# Patient Record
Sex: Female | Born: 1975 | Race: Black or African American | Hispanic: No | Marital: Single | Smoking: Current some day smoker
Health system: Southern US, Community
[De-identification: ages and names within clinical notes are randomized; demographics above are authoritative.]

---

## 2017-09-12 ENCOUNTER — Emergency Department (HOSPITAL_COMMUNITY)
Admission: EM | Admit: 2017-09-12 | Discharge: 2017-09-12 | Discharge status: Against Medical Advice | Payer: Medicaid Other | Attending: Emergency Medicine | Admitting: Emergency Medicine

## 2017-09-12 ENCOUNTER — Encounter (HOSPITAL_COMMUNITY): Payer: Self-pay | Admitting: *Deleted

## 2017-09-12 DIAGNOSIS — R51 Headache: Secondary | ICD-10-CM | POA: Insufficient documentation

## 2017-09-12 NOTE — ED Triage Notes (Signed)
Pt says she has headache some dizziness and is worried about hypertension.  Pt denies having hx of HTN.  Pt is c/o left eye swelling.  She thinks she is having allergies after moving from [place].  No meds taken at home.

## 2017-09-12 NOTE — ED Notes (Signed)
Called for room, no answer

## 2017-09-12 NOTE — ED Notes (Signed)
Pt left said she was going somewhere else

## 2017-09-13 ENCOUNTER — Encounter (HOSPITAL_COMMUNITY): Payer: Self-pay | Admitting: Emergency Medicine

## 2017-09-13 ENCOUNTER — Ambulatory Visit (HOSPITAL_COMMUNITY)
Admission: EM | Admit: 2017-09-13 | Discharge: 2017-09-13 | Disposition: A | Discharge status: Home / Self Care | Payer: Self-pay | Attending: Family Medicine | Admitting: Family Medicine

## 2017-09-13 DIAGNOSIS — H00014 Hordeolum externum left upper eyelid: Secondary | ICD-10-CM

## 2017-09-13 DIAGNOSIS — S39012A Strain of muscle, fascia and tendon of lower back, initial encounter: Secondary | ICD-10-CM

## 2017-09-13 MED ORDER — PREDNISONE 20 MG PO TABS: ORAL_TABLET | ORAL | 0 refills | Status: DC

## 2017-09-13 MED ORDER — TOBRAMYCIN 0.3 % OP SOLN: 1.0000 [drp] | Freq: Four times a day (QID) | OPHTHALMIC | 0 refills | Status: DC

## 2017-09-13 NOTE — Discharge Instructions (Signed)
Warm compresses to the left upper eyelid

## 2017-09-13 NOTE — ED Triage Notes (Signed)
PT reports lower back pain that is worse with movement for 5 days. No dysuria.  PT has a "bump" on left eye for 2 weeks.

## 2017-09-13 NOTE — ED Provider Notes (Signed)
  [facility]   [id number] 09/13/17 Arrival Time: 1229   SUBJECTIVE:  Vicki Vincent is a 41 y.o. female who presents to the urgent care with complaint of PT reports lower back pain that is worse with movement for 5 days. No dysuria.  No recalled injury.  PT has a "bump" on left eye for 2 weeks.   Patient recently moved from [facility] which involves moving quite a bit of personal belongings. Her mother is sick and lives in medicine. Patient has a new job working in the lab.  Also notices her face has broken out.     History reviewed. No pertinent past medical history. No family history on file. Social History   Social History  . Marital status: Single    Spouse name: N/A  . Number of children: N/A  . Years of education: N/A   Occupational History  . Not on file.   Social History Main Topics  . Smoking status: Current Every Day Smoker    Packs/day: 0.20    Types: Cigarettes  . Smokeless tobacco: Never Used  . Alcohol use Yes  . Drug use: No  . Sexual activity: Not on file   Other Topics Concern  . Not on file   Social History Narrative  . No narrative on file   No outpatient prescriptions have been marked as taking for the 09/13/17 encounter [facility] Encounter).   No Known Allergies    ROS: As per HPI, remainder of ROS negative.   OBJECTIVE:   Vitals:   09/13/17 1329 09/13/17 1331  BP:  113/84  Pulse:  84  Resp:  16  Temp:  98.6 F (37 C)  TempSrc:  Oral  SpO2:  100%  Weight: 220 lb (99.8 kg)   Height:  (1.727 m)      General appearance: alert; no distress Eyes: PERRL; EOMI; conjunctiva normal HENT: normocephalic; atraumatic; TMs normal, canal normal, external ears normal without trauma; nasal mucosa normal; oral mucosa normal Neck: supple Lungs: clear to auscultation bilaterally Heart: regular rate and rhythm Abdomen: soft, non-tender; bowel sounds normal; no masses or organomegaly; no guarding or rebound tenderness Back:  no CVA tenderness Extremities: no cyanosis or edema; symmetrical with no gross deformities Skin: warm and dry Neurologic: normal gait; grossly normal Psychological: alert and cooperative; normal mood and affect      Labs:  No results found for this or any previous visit.  Labs Reviewed - No data to display  No results found.     ASSESSMENT & PLAN:  1. Strain of lumbar region, initial encounter   2. Hordeolum externum of left upper eyelid     Meds ordered this encounter  Medications  . tobramycin (TOBREX) 0.3 % ophthalmic solution    Sig: Place 1 drop into the left eye every 6 (six) hours.    Dispense:  5 mL    Refill:  0  . predniSONE (DELTASONE) 20 MG tablet    Sig: Two daily with food    Dispense:  10 tablet    Refill:  0    Reviewed expectations re: course of current medical issues. Questions answered. Outlined signs and symptoms indicating need for more acute intervention. Patient verbalized understanding. After Visit Summary given.    Procedures:      Elvina Sidle, MD 09/13/17 1339

## 2017-11-13 ENCOUNTER — Ambulatory Visit (HOSPITAL_COMMUNITY)
Admission: EM | Admit: 2017-11-13 | Discharge: 2017-11-13 | Disposition: A | Discharge status: Home / Self Care | Payer: Medicaid Other | Attending: Family Medicine | Admitting: Family Medicine

## 2017-11-13 ENCOUNTER — Encounter (HOSPITAL_COMMUNITY): Payer: Self-pay | Admitting: Emergency Medicine

## 2017-11-13 DIAGNOSIS — H5789 Other specified disorders of eye and adnexa: Secondary | ICD-10-CM

## 2017-11-13 MED ORDER — POLYETHYL GLYCOL-PROPYL GLYCOL 0.4-0.3 % OP GEL: 1.0000 "application " | Freq: Every evening | OPHTHALMIC | 0 refills | Status: DC | PRN

## 2017-11-13 MED ORDER — SULFACETAMIDE SODIUM 10 % OP SOLN: 1.0000 [drp] | Freq: Four times a day (QID) | OPHTHALMIC | 0 refills | Status: AC

## 2017-11-13 NOTE — ED Provider Notes (Signed)
[facility]    CSN: [id number] Arrival date & time: 11/13/17  1417     History   Chief Complaint Chief Complaint  Patient presents with  . Eye Problem    HPI Vicki Vincent is a 41 y.o. female.   41 year old female comes in for eye irritation after possible paint getting into bilateral eyes. Patient states she was at a store buying paint, when the sales person closed the cap of the paint can, paint splashed all over her. She used her hands to remove paint on her face at the time without irritation to the eyes. States later on in the day at home, noticed some stinging sensation to the eyes with watering and blurred vision. She went to bed and woke up with crusting on bilateral eyes and came in for evaluation. She denies sensitivity to light, contact lens use. She does have prescription glasses, but does not have it on her today. No eye redness.       History reviewed. No pertinent past medical history.  There are no active problems to display for this patient.   History reviewed. No pertinent surgical history.  OB History    No data available       Home Medications    Prior to Admission medications   Medication Sig Start Date End Date Taking? Authorizing Provider  Polyethyl Glycol-Propyl Glycol (SYSTANE) 0.4-0.3 % GEL ophthalmic gel Place 1 application into both eyes at bedtime as needed. 11/13/17   Belinda FisherYu, Amy V, PA-C  predniSONE (DELTASONE) 20 MG tablet Two daily with food 09/13/17   Elvina SidleLauenstein, Kurt, MD  sulfacetamide (BLEPH-10) 10 % ophthalmic solution Place 1-2 drops into both eyes 4 (four) times daily for 5 days. 11/13/17 11/18/17  Cathie HoopsYu, Amy V, PA-C  tobramycin (TOBREX) 0.3 % ophthalmic solution Place 1 drop into the left eye every 6 (six) hours. 09/13/17   Elvina SidleLauenstein, Kurt, MD    Family History History reviewed. No pertinent family history.  Social History Social History   Tobacco Use  . Smoking status: Current Every Day Smoker    Packs/day: 0.20   Types: Cigarettes  . Smokeless tobacco: Never Used  Substance Use Topics  . Alcohol use: Yes  . Drug use: No     Allergies   Patient has no known allergies.   Review of Systems Review of Systems  Reason unable to perform ROS: See HPI as above.     Physical Exam Triage Vital Signs ED Triage Vitals  Enc Vitals Group     BP 11/13/17 1444 108/74     Pulse Rate 11/13/17 1444 84     Resp 11/13/17 1444 18     Temp 11/13/17 1444 98 F (36.7 C)     Temp Source 11/13/17 1444 Oral     SpO2 11/13/17 1444 99 %     Weight --      Height --      Head Circumference --      Peak Flow --      Pain Score 11/13/17 1446 8     Pain Loc --      Pain Edu? --      Excl. in GC? --    No data found.  Updated Vital Signs BP 108/74 (BP Location: Left Arm)   Pulse 84   Temp 98 F (36.7 C) (Oral)   Resp 18   LMP 11/02/2017   SpO2 99%   Visual Acuity Right Eye Distance: (S) 20/100 did not have  her glasses on  Left Eye Distance: (S) 20/70 did not have her glassse on Bilateral Distance: (S) 20/50 did not have her glasses on   Right Eye Near:   Left Eye Near:    Bilateral Near:     Physical Exam  Constitutional: She is oriented to person, place, and time. She appears well-developed and well-nourished. No distress.  HENT:  Head: Normocephalic and atraumatic.  Eyes: Conjunctivae, EOM and lids are normal. Pupils are equal, round, and reactive to light. Lids are everted and swept, no foreign bodies found. Right conjunctiva is not injected. Left conjunctiva is not injected.  Fluorescein stain without uptake.  Neurological: She is alert and oriented to person, place, and time.     UC Treatments / Results  Labs (all labs ordered are listed, but only abnormal results are displayed) Labs Reviewed - No data to display  EKG  EKG Interpretation None       Radiology No results found.  Procedures Procedures (including critical care time)  Medications Ordered in UC Medications -  No data to display   Initial Impression / Assessment and Plan / UC Course  I have reviewed the triage vital signs and the nursing notes.  Pertinent labs & imaging results that were available during my care of the patient were reviewed by me and considered in my medical decision making (see chart for details).    Normal eye exam. Discussed case with Dr Milus GlazierLauenstein, given one day history of event, pH was not obtained. Given crusting of the eye this morning, will cover with sulfacetamide. Artificial tear gel as directed. Lid scrubs and warm compresses. Patient to follow up with ophthalmology if symptoms worsens, or does not improve.   Final Clinical Impressions(s) / UC Diagnoses   Final diagnoses:  Eye irritation    ED Discharge Orders        Ordered    sulfacetamide (BLEPH-10) 10 % ophthalmic solution  4 times daily     11/13/17 1507    Polyethyl Glycol-Propyl Glycol (SYSTANE) 0.4-0.3 % GEL ophthalmic gel  At bedtime PRN     11/13/17 1507       Belinda FisherYu, Amy V, PA-C 11/13/17 1518

## 2017-11-13 NOTE — Discharge Instructions (Signed)
Use sulfacetamide eyedrops as directed on both eyes. Artificial tear gel at night. Wait 10-15 minutes between drops, always use artificial tear gel last, as it prevents drops from penetrating through. Lid scrubs and warm compresses as directed. Monitor for any worsening of symptoms, changes in vision, sensitivity to light, eye swelling, follow up with ophthalmology for further evaluation.

## 2017-11-13 NOTE — ED Triage Notes (Signed)
PT C/O: house pain being splashed on bilateral eyes while at work   ONSET: yest  SX INCLUDE: itching and burning, and watery, blurred vision  DENIES:  TAKING MEDS: none  A&O x4... NAD... Ambulatory

## 2018-03-16 ENCOUNTER — Encounter (HOSPITAL_COMMUNITY): Payer: Self-pay | Admitting: Family Medicine

## 2018-03-16 ENCOUNTER — Ambulatory Visit (HOSPITAL_COMMUNITY)
Admission: EM | Admit: 2018-03-16 | Discharge: 2018-03-16 | Disposition: A | Discharge status: Home / Self Care | Payer: Medicaid Other | Attending: Family Medicine | Admitting: Family Medicine

## 2018-03-16 DIAGNOSIS — S39012A Strain of muscle, fascia and tendon of lower back, initial encounter: Secondary | ICD-10-CM | POA: Diagnosis not present

## 2018-03-16 DIAGNOSIS — M545 Low back pain: Secondary | ICD-10-CM

## 2018-03-16 LAB — POCT URINALYSIS DIP (DEVICE)
Bilirubin Urine: NEGATIVE
Glucose, UA: NEGATIVE mg/dL
Ketones, ur: NEGATIVE mg/dL
Leukocytes, UA: NEGATIVE
NITRITE: NEGATIVE
PH: 7 (ref 5.0–8.0)
Protein, ur: NEGATIVE mg/dL
SPECIFIC GRAVITY, URINE: 1.02 (ref 1.005–1.030)
UROBILINOGEN UA: 2 mg/dL — AB (ref 0.0–1.0)

## 2018-03-16 MED ORDER — METHYLPREDNISOLONE 4 MG PO TABS: 4.0000 mg | ORAL_TABLET | Freq: Three times a day (TID) | ORAL | 0 refills | Status: DC

## 2018-03-16 MED ORDER — TRAMADOL HCL 50 MG PO TABS: 50.0000 mg | ORAL_TABLET | Freq: Four times a day (QID) | ORAL | 0 refills | Status: DC | PRN

## 2018-03-16 NOTE — ED Provider Notes (Signed)
[facility]   [id number] 03/16/18 Arrival Time: 1400   SUBJECTIVE:  Vicki Vincent is a 42 y.o. 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  Some pain radiating into buttocks.    Patient is on her menstrual cycle.     History reviewed. No pertinent past medical history. No family history on file. Social History   Socioeconomic History  . Marital status: Single    Spouse name: Not on file  . Number of children: Not on file  . Years of education: Not on file  . Highest education level: Not on file  Occupational History  . Not on file  Social Needs  . Financial resource strain: Not on file  . Food insecurity:    Worry: Not on file    Inability: Not on file  . Transportation needs:    Medical: Not on file    Non-medical: Not on file  Tobacco Use  . Smoking status: Current Every Day Smoker    Packs/day: 0.20    Types: Cigarettes  . Smokeless tobacco: Never Used  Substance and Sexual Activity  . Alcohol use: Yes  . Drug use: No  . Sexual activity: Not on file  Lifestyle  . Physical activity:    Days per week: Not on file    Minutes per session: Not on file  . Stress: Not on file  Relationships  . Social connections:    Talks on phone: Not on file    Gets together: Not on file    Attends religious service: Not on file    Active member of club or organization: Not on file    Attends meetings of clubs or organizations: Not on file    Relationship status: Not on file  . Intimate partner violence:    Fear of current or ex partner: Not on file    Emotionally abused: Not on file    Physically abused: Not on file    Forced sexual activity: Not on file  Other Topics Concern  . Not on file  Social History Narrative  . Not on file   No outpatient medications have been marked as taking for the 03/16/18 encounter  [facility](Hospital Encounter).   No Known Allergies    ROS: As per HPI, remainder of ROS negative.   OBJECTIVE:   Vitals:   03/16/18 1450  BP: 107/71  Pulse: 90  Resp: 16  Temp: 98.2 F (36.8 C)  TempSrc: Oral  SpO2: 98%     General appearance: alert; no distress Eyes: PERRL; EOMI; conjunctiva normal HENT: normocephalic; atraumatic; TMs normal, canal normal, external ears normal without trauma; nasal mucosa normal; oral mucosa normal Neck: supple Abdomen: soft, non-tender; bowel sounds normal; no masses or organomegaly; no guarding or rebound tenderness Back: no CVA tenderness; tender paraspinal lumbar muscles.  Extremities: no cyanosis or edema; symmetrical with no gross deformities Skin: warm and dry Neurologic: slow waddling gait; grossly normal Psychological: alert and cooperative; normal mood and affect      Labs:  Results for orders placed or performed during the hospital encounter of 03/16/18  POCT urinalysis dip (device)  Result Value Ref Range   Glucose, UA NEGATIVE NEGATIVE mg/dL   Bilirubin Urine NEGATIVE NEGATIVE   Ketones, ur NEGATIVE NEGATIVE mg/dL   Specific Gravity, Urine 1.020 1.005 - 1.030   Hgb urine dipstick LARGE (A) NEGATIVE  pH 7.0 5.0 - 8.0   Protein, ur NEGATIVE NEGATIVE mg/dL   Urobilinogen, UA 2.0 (H) 0.0 - 1.0 mg/dL   Nitrite NEGATIVE NEGATIVE   Leukocytes, UA NEGATIVE NEGATIVE    Labs Reviewed  POCT URINALYSIS DIP (DEVICE) - Abnormal; Notable for the following components:      Result Value   Hgb urine dipstick LARGE (*)    Urobilinogen, UA 2.0 (*)    All other components within normal limits    No results found.     ASSESSMENT & PLAN:  1. Strain of lumbar region, initial encounter     Meds ordered this encounter  Medications  . methylPREDNISolone (MEDROL) 4 MG tablet    Sig: Take 1 tablet (4 mg total) by mouth 3 (three) times daily before meals.    Dispense:  15 tablet    Refill:  0  . traMADol (ULTRAM) 50 MG tablet      Sig: Take 1 tablet (50 mg total) by mouth every 6 (six) hours as needed.    Dispense:  15 tablet    Refill:  0    Reviewed expectations re: course of current medical issues. Questions answered. Outlined signs and symptoms indicating need for more acute intervention. Patient verbalized understanding. After Visit Summary given.     Elvina Sidle, MD 03/16/18 [phone]

## 2018-03-16 NOTE — ED Triage Notes (Signed)
Pt here for bilateral lower back pain with radiation into her legs. Denies injury,

## 2019-06-04 ENCOUNTER — Encounter (HOSPITAL_COMMUNITY): Payer: Self-pay | Admitting: Emergency Medicine

## 2019-06-04 ENCOUNTER — Other Ambulatory Visit: Payer: Self-pay

## 2019-06-04 ENCOUNTER — Ambulatory Visit (HOSPITAL_COMMUNITY)
Admission: EM | Admit: 2019-06-04 | Discharge: 2019-06-04 | Disposition: A | Discharge status: Home / Self Care | Payer: Medicaid Other | Attending: Family Medicine | Admitting: Family Medicine

## 2019-06-04 DIAGNOSIS — M62830 Muscle spasm of back: Secondary | ICD-10-CM | POA: Diagnosis not present

## 2019-06-04 DIAGNOSIS — N760 Acute vaginitis: Secondary | ICD-10-CM

## 2019-06-04 DIAGNOSIS — B9689 Other specified bacterial agents as the cause of diseases classified elsewhere: Secondary | ICD-10-CM | POA: Diagnosis not present

## 2019-06-04 DIAGNOSIS — Z3202 Encounter for pregnancy test, result negative: Secondary | ICD-10-CM | POA: Diagnosis not present

## 2019-06-04 LAB — POCT URINALYSIS DIP (DEVICE)
Bilirubin Urine: NEGATIVE
Glucose, UA: NEGATIVE mg/dL
Ketones, ur: NEGATIVE mg/dL
Leukocytes,Ua: NEGATIVE
Nitrite: NEGATIVE
Protein, ur: NEGATIVE mg/dL
Specific Gravity, Urine: 1.02 (ref 1.005–1.030)
Urobilinogen, UA: 1 mg/dL (ref 0.0–1.0)
pH: 7.5 (ref 5.0–8.0)

## 2019-06-04 LAB — POCT PREGNANCY, URINE: Preg Test, Ur: NEGATIVE

## 2019-06-04 MED ORDER — CYCLOBENZAPRINE HCL 5 MG PO TABS: 5.0000 mg | ORAL_TABLET | Freq: Two times a day (BID) | ORAL | 0 refills | Status: AC | PRN

## 2019-06-04 MED ORDER — FLUCONAZOLE 200 MG PO TABS: 200.0000 mg | ORAL_TABLET | Freq: Once | ORAL | 0 refills | Status: AC

## 2019-06-04 MED ORDER — METRONIDAZOLE 500 MG PO TABS: 500.0000 mg | ORAL_TABLET | Freq: Two times a day (BID) | ORAL | 0 refills | Status: DC

## 2019-06-04 NOTE — ED Triage Notes (Addendum)
2 months ago had an abortion, but did not go to follow up visit.  Patient has noticed vaginal discharge 2 days ago and now has back pain, left, mid back pain

## 2019-06-04 NOTE — Discharge Instructions (Signed)
We will call you if your tests are positive, and send medicine if needed.

## 2019-06-04 NOTE — ED Provider Notes (Signed)
[place]    CSN: [id number] Arrival date & time: 06/04/19  1221     History   Chief Complaint Chief Complaint  Patient presents with  . Back Pain    HPI Vicki Vincent is a 43 y.o. female presenting for multiple concerns.  Patient reporting left upper to mid back pain and tension for the last 2 days.  Patient states that she has a history of spasms, had several yesterday.  Patient denies injury to the area, radiation of pain, upper extremity paresthesias, neck pain.  She has tried hot compresses, stretching, and Aleve with minimal relief of symptoms.  States she has taken Flexeril in the past for this with adequate relief.  Patient also notes vaginal discharge for the last few weeks.  Patient has history of BV, states that this feels the same.  Patient is endorsing thin, malodorous discharge.  Patient denies abdominal pain, pelvic pain, vaginal pain/pruritus, anogenital lesions, vaginal bleeding.  Patient denies douching, change in detergent, or using "smelly things because my doctor told me not to ".  Patient states that she was treated with metronidazole for this years ago with successful resolution of symptoms.  Patient states that she does tend to get yeast infections after antibiotics.  Of note, patient underwent procedural abortion 2 months ago, and was told after the procedure to do urine pregnancy test, but never did this.  Patient currently sexually active with one female partner, not using condoms.  Patient requesting STD screening "to make sure I'm good ".     History reviewed. No pertinent past medical history.  There are no active problems to display for this patient.   History reviewed. No pertinent surgical history.  OB History   No obstetric history on file.      Home Medications    Prior to Admission medications   Medication Sig Start Date End Date Taking? Authorizing Provider  norgestimate-ethinyl estradiol (ORTHO-CYCLEN) 0.25-35 MG-MCG tablet  Take 1 tablet by mouth daily.   Yes [provider]  cyclobenzaprine (FLEXERIL) 5 MG tablet Take 1 tablet (5 mg total) by mouth 2 (two) times daily as needed for up to 5 days for muscle spasms. 06/04/19 06/09/19  Hall-Potvin, [place], PA-C  fluconazole (DIFLUCAN) 200 MG tablet Take 1 tablet (200 mg total) by mouth once for 1 dose. May repeat in 72 hours if needed 06/04/19 06/04/19  Hall-Potvin, [place], PA-C  metroNIDAZOLE (FLAGYL) 500 MG tablet Take 1 tablet (500 mg total) by mouth 2 (two) times daily. 06/04/19   Hall-Potvin, [place], PA-C    Family History History reviewed. No pertinent family history.  Social History Social History   Tobacco Use  . Smoking status: Current Every Day Smoker    Packs/day: 0.20    Types: Cigarettes  . Smokeless tobacco: Never Used  Substance Use Topics  . Alcohol use: Yes  . Drug use: No     Allergies   Patient has no known allergies.   Review of Systems As per HPI   Physical Exam Triage Vital Signs ED Triage Vitals  Enc Vitals Group     BP 06/04/19 1242 114/78     Pulse Rate 06/04/19 1242 97     Resp 06/04/19 1242 18     Temp 06/04/19 1242 98.3 F (36.8 C)     Temp Source 06/04/19 1242 Oral     SpO2 06/04/19 1242 97 %     Weight --      Height --  Head Circumference --      Peak Flow --      Pain Score 06/04/19 1237 9     Pain Loc --      Pain Edu? --      Excl. in GC? --    No data found.  Updated Vital Signs BP 114/78 (BP Location: Right Arm) Comment (BP Location): large cuff  Pulse 97   Temp 98.3 F (36.8 C) (Oral)   Resp 18   LMP 05/28/2019   SpO2 97%   Visual Acuity Right Eye Distance:   Left Eye Distance:   Bilateral Distance:    Right Eye Near:   Left Eye Near:    Bilateral Near:     Physical Exam Constitutional:      General: She is not in acute distress. HENT:     Head: Normocephalic and atraumatic.  Eyes:     General: No scleral icterus.    Pupils: Pupils are equal, round, and reactive  to light.  Cardiovascular:     Rate and Rhythm: Normal rate.  Pulmonary:     Effort: Pulmonary effort is normal.  Skin:    Coloration: Skin is not jaundiced or pale.  Neurological:     Mental Status: She is alert and oriented to person, place, and time.      UC Treatments / Results  Labs (all labs ordered are listed, but only abnormal results are displayed) Labs Reviewed  POCT URINALYSIS DIP (DEVICE) - Abnormal; Notable for the following components:      Result Value   Hgb urine dipstick TRACE (*)    All other components within normal limits  POC URINE PREG, ED  POCT PREGNANCY, URINE  CERVICOVAGINAL ANCILLARY ONLY    EKG None  Radiology No results found.  Procedures Procedures (including critical care time)  Medications Ordered in UC Medications - No data to display  Initial Impression / Assessment and Plan / UC Course  I have reviewed the triage vital signs and the nursing notes.  Pertinent labs & imaging results that were available during my care of the patient were reviewed by me and considered in my medical decision making (see chart for details).  Clinical Course as of Jun 03 1345  Wed Jun 04, 2019  1243 Cervicovaginal ancillary only [BH]    Clinical Course User Index [BH] Hall-Potvin, [place], [place]-C    43 year old female with a remote history of BV and muscle spasms presenting for abnormal discharge and left upper back pain/spasm.  Physical exam reassuring.  Will treat for spasm, BV and provide Diflucan as patient tends to get yeast infections status post antibiotic use. Final Clinical Impressions(s) / UC Diagnoses   Final diagnoses:  BV (bacterial vaginosis)  Muscle spasm of back     Discharge Instructions     We will call you if your tests are positive, and send medicine if needed.    ED Prescriptions    Medication Sig Dispense Auth. Provider   metroNIDAZOLE (FLAGYL) 500 MG tablet Take 1 tablet (500 mg total) by mouth 2 (two) times daily. 14  tablet Hall-Potvin, [place], PA-C   fluconazole (DIFLUCAN) 200 MG tablet Take 1 tablet (200 mg total) by mouth once for 1 dose. May repeat in 72 hours if needed 2 tablet Hall-Potvin, [place], PA-C   cyclobenzaprine (FLEXERIL) 5 MG tablet Take 1 tablet (5 mg total) by mouth 2 (two) times daily as needed for up to 5 days for muscle spasms. 10 tablet Hall-Potvin, [place], PA-C  Controlled Substance Prescriptions [place] Controlled Substance Registry consulted? Not Applicable   Shea EvansHall-Potvin, , [place] JerseyPA-C 06/04/19 1347

## 2019-06-05 LAB — CERVICOVAGINAL ANCILLARY ONLY
Bacterial vaginitis: POSITIVE — AB
Candida vaginitis: NEGATIVE
Chlamydia: NEGATIVE
Neisseria Gonorrhea: NEGATIVE
Trichomonas: NEGATIVE

## 2019-07-08 ENCOUNTER — Ambulatory Visit (HOSPITAL_COMMUNITY)
Admission: EM | Admit: 2019-07-08 | Discharge: 2019-07-08 | Disposition: A | Discharge status: Home / Self Care | Payer: Medicaid Other | Attending: Family Medicine | Admitting: Family Medicine

## 2019-07-08 ENCOUNTER — Encounter (HOSPITAL_COMMUNITY): Payer: Self-pay | Admitting: Family Medicine

## 2019-07-08 ENCOUNTER — Other Ambulatory Visit: Payer: Self-pay

## 2019-07-08 DIAGNOSIS — M542 Cervicalgia: Secondary | ICD-10-CM

## 2019-07-08 DIAGNOSIS — M545 Low back pain, unspecified: Secondary | ICD-10-CM

## 2019-07-08 MED ORDER — MELOXICAM 7.5 MG PO TABS: 7.5000 mg | ORAL_TABLET | Freq: Every day | ORAL | 0 refills | Status: DC

## 2019-07-08 MED ORDER — CYCLOBENZAPRINE HCL 10 MG PO TABS: 10.0000 mg | ORAL_TABLET | Freq: Two times a day (BID) | ORAL | 0 refills | Status: DC | PRN

## 2019-07-08 MED ORDER — KETOROLAC TROMETHAMINE 30 MG/ML IJ SOLN
30.0000 mg | Freq: Once | INTRAMUSCULAR | Status: AC
  Administered 2019-07-08: 30 mg via INTRAMUSCULAR

## 2019-07-08 MED ORDER — KETOROLAC TROMETHAMINE 30 MG/ML IJ SOLN
INTRAMUSCULAR | Status: AC
  Filled 2019-07-08: qty 1
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
## 2019-07-23 ENCOUNTER — Emergency Department (HOSPITAL_COMMUNITY): Payer: Medicaid Other

## 2019-07-23 ENCOUNTER — Emergency Department (HOSPITAL_COMMUNITY)
Admission: EM | Admit: 2019-07-23 | Discharge: 2019-07-24 | Discharge status: Against Medical Advice | Payer: Medicaid Other | Attending: Emergency Medicine | Admitting: Emergency Medicine

## 2019-07-23 ENCOUNTER — Other Ambulatory Visit: Payer: Self-pay

## 2019-07-23 ENCOUNTER — Encounter (HOSPITAL_COMMUNITY): Payer: Self-pay

## 2019-07-23 DIAGNOSIS — F1310 Sedative, hypnotic or anxiolytic abuse, uncomplicated: Secondary | ICD-10-CM | POA: Insufficient documentation

## 2019-07-23 DIAGNOSIS — F1210 Cannabis abuse, uncomplicated: Secondary | ICD-10-CM | POA: Insufficient documentation

## 2019-07-23 DIAGNOSIS — F1410 Cocaine abuse, uncomplicated: Secondary | ICD-10-CM | POA: Diagnosis not present

## 2019-07-23 DIAGNOSIS — R000 Tachycardia, unspecified: Secondary | ICD-10-CM | POA: Diagnosis not present

## 2019-07-23 DIAGNOSIS — R4781 Slurred speech: Secondary | ICD-10-CM | POA: Diagnosis not present

## 2019-07-23 DIAGNOSIS — Z5320 Procedure and treatment not carried out because of patient's decision for unspecified reasons: Secondary | ICD-10-CM | POA: Diagnosis not present

## 2019-07-23 DIAGNOSIS — R4182 Altered mental status, unspecified: Secondary | ICD-10-CM | POA: Diagnosis present

## 2019-07-23 DIAGNOSIS — F1910 Other psychoactive substance abuse, uncomplicated: Secondary | ICD-10-CM

## 2019-07-23 LAB — URINALYSIS, ROUTINE W REFLEX MICROSCOPIC
Bilirubin Urine: NEGATIVE
Glucose, UA: NEGATIVE mg/dL
Hgb urine dipstick: NEGATIVE
Ketones, ur: NEGATIVE mg/dL
Leukocytes,Ua: NEGATIVE
Nitrite: NEGATIVE
Protein, ur: NEGATIVE mg/dL
Specific Gravity, Urine: 1.011 (ref 1.005–1.030)
pH: 6 (ref 5.0–8.0)

## 2019-07-23 LAB — COMPREHENSIVE METABOLIC PANEL
ALT: 26 U/L (ref 0–44)
AST: 24 U/L (ref 15–41)
Albumin: 3 g/dL — ABNORMAL LOW (ref 3.5–5.0)
Alkaline Phosphatase: 58 U/L (ref 38–126)
Anion gap: 10 (ref 5–15)
BUN: 8 mg/dL (ref 6–20)
CO2: 22 mmol/L (ref 22–32)
Calcium: 9.3 mg/dL (ref 8.9–10.3)
Chloride: 107 mmol/L (ref 98–111)
Creatinine, Ser: 0.54 mg/dL (ref 0.44–1.00)
GFR calc Af Amer: >60 mL/min (ref >60)
GFR calc non Af Amer: >60 mL/min (ref >60)
Glucose, Bld: 96 mg/dL (ref 70–99)
Potassium: 3.8 mmol/L (ref 3.5–5.1)
Sodium: 139 mmol/L (ref 135–145)
Total Bilirubin: 0.1 mg/dL — ABNORMAL LOW (ref 0.3–1.2)
Total Protein: 6.9 g/dL (ref 6.5–8.1)

## 2019-07-23 LAB — RAPID URINE DRUG SCREEN, HOSP PERFORMED
Amphetamines: NOT DETECTED
Barbiturates: NOT DETECTED
Benzodiazepines: POSITIVE — AB
Cocaine: POSITIVE — AB
Opiates: NOT DETECTED
Tetrahydrocannabinol: POSITIVE — AB

## 2019-07-23 LAB — SALICYLATE LEVEL: Salicylate Lvl: <7 mg/dL (ref 2.8–30.0)

## 2019-07-23 LAB — CBC WITH DIFFERENTIAL/PLATELET
Abs Immature Granulocytes: 0.01 10*3/uL (ref 0.00–0.07)
Basophils Absolute: 0 10*3/uL (ref 0.0–0.1)
Basophils Relative: 0 %
Eosinophils Absolute: 0.1 10*3/uL (ref 0.0–0.5)
Eosinophils Relative: 1 %
HCT: 35.5 % — ABNORMAL LOW (ref 36.0–46.0)
Hemoglobin: 11.2 g/dL — ABNORMAL LOW (ref 12.0–15.0)
Immature Granulocytes: 0 %
Lymphocytes Relative: 39 %
Lymphs Abs: 2.2 10*3/uL (ref 0.7–4.0)
MCH: 27.1 pg (ref 26.0–34.0)
MCHC: 31.5 g/dL (ref 30.0–36.0)
MCV: 85.7 fL (ref 80.0–100.0)
Monocytes Absolute: 0.4 10*3/uL (ref 0.1–1.0)
Monocytes Relative: 7 %
Neutro Abs: 3.1 10*3/uL (ref 1.7–7.7)
Neutrophils Relative %: 53 %
Platelets: 364 10*3/uL (ref 150–400)
RBC: 4.14 MIL/uL (ref 3.87–5.11)
RDW: 13.9 % (ref 11.5–15.5)
WBC: 5.7 10*3/uL (ref 4.0–10.5)
nRBC: 0 % (ref 0.0–0.2)

## 2019-07-23 LAB — ACETAMINOPHEN LEVEL: Acetaminophen (Tylenol), Serum: <10 ug/mL — ABNORMAL LOW (ref 10–30)

## 2019-07-23 LAB — I-STAT BETA HCG BLOOD, ED (MC, WL, AP ONLY): I-stat hCG, quantitative: <5 m[IU]/mL (ref <5)

## 2019-07-23 LAB — ETHANOL: Alcohol, Ethyl (B): <10 mg/dL (ref <10)

## 2019-07-23 MED ORDER — SODIUM CHLORIDE 0.9 % IV BOLUS
1000.0000 mL | Freq: Once | INTRAVENOUS | Status: AC
  Administered 2019-07-23: 19:00:00 1000 mL via INTRAVENOUS

## 2019-07-23 NOTE — ED Notes (Signed)
Patient transported to CT 

## 2019-07-23 NOTE — Discharge Instructions (Addendum)
1. Medications: usual home medications 2. Treatment: rest, drink plenty of fluids,  3. Follow Up: Please followup with your primary doctor in 2-3 days for discussion of your diagnoses and further evaluation after today's visit; if you do not have a primary care doctor use the resource guide provided to find one; Please return to the ER for return of confusion, new or worsening symptoms or any other concerns

## 2019-07-23 NOTE — ED Notes (Signed)
Pt up walking around, trying to pull things out of drawers severely altered. Pt placed back into bed and relocated to different room for observation.

## 2019-07-23 NOTE — ED Provider Notes (Cosign Needed)
[place] EMERGENCY DEPARTMENT Provider Note   CSN: [id number] Arrival date & time: 07/23/19  1725    History   Chief Complaint Chief Complaint  Patient presents with  . Altered Mental Status    HPI Vicki Vincent is a 43 y.o. female.     The history is provided by the patient and medical records. No language interpreter was used.  Altered Mental Status Associated symptoms: weakness   Associated symptoms: no fever and no headaches    Vicki Vincent is a 43 y.o. female  with no known PMH who presents to the Emergency Department today.  Per EMS, patient was difficult to arouse this morning and was slurring her speech.  Patient tells me that she has been feeling weak generally for about a week or 2.  She states that there is no acute worsening or change in symptoms, but she just wanted to know why she was feeling so tired.  She went to the bathroom and her legs started shaking.  She tells me that her anxiety has been getting out of hand lately and that she feels like she would feel better if she could just calm down.  She denies any medications prior to arrival.  Denies illicit substances or EtOH.    History reviewed. No pertinent past medical history.  There are no active problems to display for this patient.   History reviewed. No pertinent surgical history.   OB History   No obstetric history on file.      Home Medications    Prior to Admission medications   Medication Sig Start Date End Date Taking? Authorizing Provider  cyclobenzaprine (FLEXERIL) 10 MG tablet Take 1 tablet (10 mg total) by mouth 2 (two) times daily as needed for muscle spasms. 07/08/19   Loura Halt A, NP  meloxicam (MOBIC) 7.5 MG tablet Take 1 tablet (7.5 mg total) by mouth daily. 07/08/19   Loura Halt A, NP  norgestimate-ethinyl estradiol (ORTHO-CYCLEN) 0.25-35 MG-MCG tablet Take 1 tablet by mouth daily.    [provider]    Family History History reviewed. No  pertinent family history.  Social History Social History   Tobacco Use  . Smoking status: Current Every Day Smoker    Packs/day: 0.20    Types: Cigarettes  . Smokeless tobacco: Never Used  Substance Use Topics  . Alcohol use: Yes  . Drug use: No     Allergies   Patient has no known allergies.   Review of Systems Review of Systems  Constitutional: Positive for fatigue. Negative for fever.  Neurological: Positive for weakness. Negative for dizziness, syncope, numbness and headaches.  All other systems reviewed and are negative.    Physical Exam Updated Vital Signs BP 105/73   Temp 98.1 F (36.7 C) (Oral)   Resp (!) 31   LMP 06/26/2019   Physical Exam Vitals signs and nursing note reviewed.  Constitutional:      General: She is not in acute distress.    Appearance: She is well-developed.  HENT:     Head: Normocephalic and atraumatic.  Neck:     Musculoskeletal: Neck supple.  Cardiovascular:     Rate and Rhythm: Regular rhythm. Tachycardia present.     Heart sounds: Normal heart sounds. No murmur.  Pulmonary:     Effort: Pulmonary effort is normal. No respiratory distress.     Breath sounds: Normal breath sounds.  Abdominal:     General: There is no distension.     Palpations:  Abdomen is soft.     Tenderness: There is no abdominal tenderness.  Skin:    General: Skin is warm and dry.  Neurological:     Mental Status: She is alert and oriented to person, place, and time.     Comments: A&Ox4. Speech is slurred. She is able to follow commands and answering questions appropriately..  Cranial Nerves:  II:  Peripheral visual fields grossly normal, pupils equal, round, reactive to light III, IV, VI: EOM intact bilaterally, ptosis not present V,VII: smile symmetric, eyes kept closed tightly against resistance, facial light touch sensation equal VIII: hearing grossly normal IX, X: symmetric soft palate movement, uvula elevates symmetrically  XI: bilateral shoulder  shrug symmetric and strong XII: midline tongue extension 5/5 muscle strength in upper and lower extremities bilaterally including strong and equal grip strength and dorsiflexion/plantar flexion Sensory to light touch normal in all four extremities.       ED Treatments / Results  Labs (all labs ordered are listed, but only abnormal results are displayed) Labs Reviewed  CBC WITH DIFFERENTIAL/PLATELET - Abnormal; Notable for the following components:      Result Value   Hemoglobin 11.2 (*)    HCT 35.5 (*)    All other components within normal limits  COMPREHENSIVE METABOLIC PANEL - Abnormal; Notable for the following components:   Albumin 3.0 (*)    Total Bilirubin 0.1 (*)    All other components within normal limits  ACETAMINOPHEN LEVEL - Abnormal; Notable for the following components:   Acetaminophen (Tylenol), Serum <10 (*)    All other components within normal limits  RAPID URINE DRUG SCREEN, HOSP PERFORMED - Abnormal; Notable for the following components:   Cocaine POSITIVE (*)    Benzodiazepines POSITIVE (*)    Tetrahydrocannabinol POSITIVE (*)    All other components within normal limits  ETHANOL  SALICYLATE LEVEL  URINALYSIS, ROUTINE W REFLEX MICROSCOPIC  I-STAT BETA HCG BLOOD, ED ([facility], [facility], [facility] ONLY)    EKG EKG Interpretation  Date/Time:  Wednesday July 23 2019 18:10:25 EDT Ventricular Rate:  108 PR Interval:    QRS Duration: 83 QT Interval:  340 QTC Calculation: 456 R Axis:   72 Text Interpretation:  Sinus tachycardia ST elev, probable normal early repol pattern No acute changes Nonspecific ST abnormality inferior and lateral leads No significant change since last tracing Reconfirmed by Derwood KaplanNanavati, Ankit [phone]) on 07/23/2019 7:35:24 PM   Radiology Ct Head Wo Contrast  Result Date: 07/23/2019 CLINICAL DATA:  Difficult to arouse, slurred speech EXAM: CT HEAD WITHOUT CONTRAST TECHNIQUE: Contiguous axial images were obtained from the base of the skull through the  vertex without intravenous contrast. COMPARISON:  None. FINDINGS: Brain: No evidence of acute infarction, hemorrhage, hydrocephalus, extra-axial collection or mass lesion/mass effect. Vascular: No hyperdense vessel or unexpected calcification. Skull: Normal. Negative for fracture or focal lesion. Sinuses/Orbits: No acute finding. Other: None. IMPRESSION: No acute intracranial pathology. Electronically Signed   By: Lauralyn PrimesAlex  Bibbey M.D.   On: 07/23/2019 19:32   Dg Chest Portable 1 View  Result Date: 07/23/2019 CLINICAL DATA:  Altered mental status chest pain EXAM: PORTABLE CHEST 1 VIEW COMPARISON:  None. FINDINGS: The heart size and mediastinal contours are within normal limits. Both lungs are clear. The visualized skeletal structures are unremarkable. IMPRESSION: No active disease. Electronically Signed   By: Jasmine PangKim  Fujinaga M.D.   On: 07/23/2019 19:56    Procedures Procedures (including critical care time)  Medications Ordered in ED Medications  sodium chloride 0.9 % bolus  1,000 mL (0 mLs Intravenous Stopped 07/23/19 1918)     Initial Impression / Assessment and Plan / ED Course  I have reviewed the triage vital signs and the nursing notes.  Pertinent labs & imaging results that were available during my care of the patient were reviewed by me and considered in my medical decision making (see chart for details).       Rocky Craftsanisha Para is a 43 y.o. female who presents to ED for altered mental status by EMS.  When I walked into the room to initially evaluate the patient, she was looking through the drawers and taking alcohol swabs and other supplies out of the drawers.  She was A&O x4.  She does have slurred speech.  No cranial nerve deficits.  Strength and sensation intact.  Answering questions appropriately.  She tells me that she just feels weak and full of anxiety.  Her labs are overall reassuring.  No signs of urinary tract infection.  Chest x-ray clear.  CT head without acute findings.  Urine  drug screen is positive for cocaine, benzos and THC.  Feel that her change in mental state is likely due to polysubstance abuse.  We will continue to observe her in the emergency department.  If mentation improves and she is able to ambulate independently, tolerate p.o., etc., anticipate discharge to home. Discussed case with oncoming provider McDonald who will continue to monitor and dispo appropriately.  Patient discussed with Dr. Rhunette CroftNanavati who agrees with treatment plan.    Final Clinical Impressions(s) / ED Diagnoses   Final diagnoses:  None    ED Discharge Orders    None       , Chase PicketJaime Pilcher, [place] JerseyPA-C 07/23/19 2216

## 2019-07-23 NOTE — ED Provider Notes (Addendum)
Care assumed from [facility], [place].  Please see her full H&P.  In short,  Vicki Vincent is a 43 y.o. female presents for slurred speech and altered mental status.  Initial work-up reassuring with UDS + for cocaine, benzodiazepine and THC.  CT head without abnormality.   Physical Exam  BP 114/84   Temp 98.1 F (36.7 C) (Oral)   Resp (!) 23   LMP 06/26/2019   Physical Exam Vitals signs and nursing note reviewed.  Constitutional:      General: She is not in acute distress.    Appearance: She is well-developed.  HENT:     Head: Normocephalic.  Eyes:     General: No scleral icterus.    Conjunctiva/sclera: Conjunctivae normal.  Neck:     Musculoskeletal: Normal range of motion.  Cardiovascular:     Rate and Rhythm: Normal rate.  Pulmonary:     Effort: Pulmonary effort is normal.  Musculoskeletal: Normal range of motion.  Skin:    General: Skin is warm and dry.  Neurological:     General: No focal deficit present.     Mental Status: She is alert and oriented to person, place, and time.     Comments: Ambulatory with steady gait     ED Course/Procedures   Clinical Course as of Jul 22 2342  Wed Jul 23, 2019  2210 Plan:  Pt currently resting. She is to sober and then we will reassess.     [HM]  2300 COCAINE(!): POSITIVE [HM]  2300 Benzodiazepines(!): POSITIVE [HM]  2300 Tetrahydrocannabinol(!): POSITIVE [HM]  2308 Pt alert and oriented.  She is ambulatory without assistance.  Sister at bedside reports patient is much better.  Pt reports taking "drugs" on July 14th but denies any since that time.  We did discuss her positive UDS today.     [HM]    Clinical Course User Index [HM] Kenyona Rena, Jarrett Soho, PA-C    Procedures  MDM    Pt is able to ambulate here in the ED and her mental status has improved. She is able to orient and is tolerating PO.  Patient and sister are comfortable with discharge home.  ED evaluation discussed including +UDS. Discussed reasons to return to  the Emergency Department including new or worsening symptoms.  Both state understanding and are in agreement with the plan.    11:39 PM At discharge, pt found to be tachycardic.  Recommended continued oral hydration and recheck in a few minutes, however pt is unwilling to stay at this time.  She denies CP or SOB.  Pt is alert and oriented.  Pt and sister aware that they may return at any time for further evaluation.  Will d/c AMA.  BP 113/71   Pulse (!) 120   Temp 98.1 F (36.7 C) (Oral)   Resp (!) 23   LMP 06/26/2019   SpO2 99%       ICD-10-CM   1. Altered mental status, unspecified altered mental status type  R41.82   2. Polysubstance abuse [facility])  F19.10          Gilberto Streck, Gwenlyn Perking 07/24/19 0010    Drenda Freeze, MD 07/24/19 [phone]

## 2019-07-23 NOTE — ED Triage Notes (Signed)
EMS reports that the pt normally walks and talks, pt was difficult to arouse this morning, slurred speech, able to follow commands. Oriented 2x, to person and place. 120/82, HR110, 97.3*f, 100%ra, cbg 93,

## 2019-07-23 NOTE — ED Notes (Signed)
Meal given to pt.

## 2019-07-24 NOTE — ED Notes (Signed)
Upon departure the pts HR was 120, the provider was notified and asked to re-eval the pt. Pt made aware and left AMA.

## 2020-01-26 ENCOUNTER — Ambulatory Visit (HOSPITAL_COMMUNITY)
Admission: EM | Admit: 2020-01-26 | Discharge: 2020-01-26 | Disposition: A | Discharge status: Home / Self Care | Payer: Medicaid Other | Attending: Family Medicine | Admitting: Family Medicine

## 2020-01-26 ENCOUNTER — Encounter (HOSPITAL_COMMUNITY): Payer: Self-pay

## 2020-01-26 ENCOUNTER — Other Ambulatory Visit: Payer: Self-pay

## 2020-01-26 DIAGNOSIS — R519 Headache, unspecified: Secondary | ICD-10-CM | POA: Insufficient documentation

## 2020-01-26 DIAGNOSIS — Z20822 Contact with and (suspected) exposure to covid-19: Secondary | ICD-10-CM | POA: Diagnosis not present

## 2020-01-26 DIAGNOSIS — J069 Acute upper respiratory infection, unspecified: Secondary | ICD-10-CM

## 2020-01-26 LAB — POCT URINALYSIS DIP (DEVICE)
Bilirubin Urine: NEGATIVE
Glucose, UA: NEGATIVE mg/dL
Hgb urine dipstick: NEGATIVE
Ketones, ur: NEGATIVE mg/dL
Leukocytes,Ua: NEGATIVE
Nitrite: NEGATIVE
Protein, ur: NEGATIVE mg/dL
Specific Gravity, Urine: >=1.03 (ref 1.005–1.030)
Urobilinogen, UA: 0.2 mg/dL (ref 0.0–1.0)
pH: 6.5 (ref 5.0–8.0)

## 2020-01-26 NOTE — Discharge Instructions (Signed)
Self isolate until covid results are back and negative.  Will notify you by phone of any positive findings. Your negative results will be sent through your MyChart.     Over the counter medications as needed for symptoms.  Push fluids to ensure adequate hydration and keep secretions thin.  Tylenol and/or ibuprofen as needed for pain or fevers.  If symptoms worsen or do not improve in the next week to return to be seen or to follow up with your PCP.

## 2020-01-26 NOTE — ED Provider Notes (Signed)
[facility]    CSN: [id number] Arrival date & time: 01/26/20  1042      History   Chief Complaint Chief Complaint  Patient presents with  . Headache    HPI Vicki Vincent is a 44 y.o. female.   Vicki Vincent presents with complaints of headache, cough. These have improved. Symptoms worse on 1/29. Has started a new job and working in a cold environment which she feels has contributed. No fevers. Mild runny nose and sore throat. These have also resolved. Diarrhea a few days ago which has resolved. Shortness of breath  A few days ago with activity, this has improved. Does have some back pain but no body aches. No pain with urination. Took mucinex as well as liquid medication her mother gave her. Requesting covid testing. States she had a coworker who had covid-19, although sounds likely she was not in contact with this person until after her quarantine had ended, however patient endorses feeling concerned about possible exposure.     ROS per HPI, negative if not otherwise mentioned.      History reviewed. No pertinent past medical history.  There are no problems to display for this patient.   History reviewed. No pertinent surgical history.  OB History   No obstetric history on file.      Home Medications    Prior to Admission medications   Medication Sig Start Date End Date Taking? Authorizing Provider  cyclobenzaprine (FLEXERIL) 10 MG tablet Take 1 tablet (10 mg total) by mouth 2 (two) times daily as needed for muscle spasms. 07/08/19   Dahlia Byes A, NP  meloxicam (MOBIC) 7.5 MG tablet Take 1 tablet (7.5 mg total) by mouth daily. 07/08/19   Dahlia Byes A, NP  norgestimate-ethinyl estradiol (ORTHO-CYCLEN) 0.25-35 MG-MCG tablet Take 1 tablet by mouth daily.    [provider]    Family History History reviewed. No pertinent family history.  Social History Social History   Tobacco Use  . Smoking status: Current Every Day Smoker    Packs/day:  0.20    Types: Cigarettes  . Smokeless tobacco: Never Used  Substance Use Topics  . Alcohol use: Yes  . Drug use: No     Allergies   Patient has no known allergies.   Review of Systems Review of Systems   Physical Exam Triage Vital Signs ED Triage Vitals  Enc Vitals Group     BP 01/26/20 1131 124/85     Pulse Rate 01/26/20 1131 88     Resp 01/26/20 1131 18     Temp 01/26/20 1131 98.1 F (36.7 C)     Temp Source 01/26/20 1131 Oral     SpO2 01/26/20 1131 99 %     Weight --      Height --      Head Circumference --      Peak Flow --      Pain Score 01/26/20 1128 8     Pain Loc --      Pain Edu? --      Excl. in [place]? --    No data found.  Updated Vital Signs BP 124/85 (BP Location: Left Arm)   Pulse 88   Temp 98.1 F (36.7 C) (Oral)   Resp 18   LMP 01/03/2020 (Approximate)   SpO2 99%    Physical Exam Constitutional:      General: She is not in acute distress.    Appearance: She is well-developed.  Cardiovascular:  Rate and Rhythm: Normal rate.  Pulmonary:     Effort: Pulmonary effort is normal.  Skin:    General: Skin is warm and dry.  Neurological:     Mental Status: She is alert and oriented to person, place, and time.      UC Treatments / Results  Labs (all labs ordered are listed, but only abnormal results are displayed) Labs Reviewed  NOVEL CORONAVIRUS, NAA (HOSP ORDER, SEND-OUT TO REF LAB; TAT 18-24 HRS)  POCT URINALYSIS DIP (DEVICE)    EKG   Radiology No results found.  Procedures Procedures (including critical care time)  Medications Ordered in UC Medications - No data to display  Initial Impression / Assessment and Plan / UC Course  I have reviewed the triage vital signs and the nursing notes.  Pertinent labs & imaging results that were available during my care of the patient were reviewed by me and considered in my medical decision making (see chart for details).     Non toxic. Benign physical exam.  ua normal. covid  testing collected and pending. Isolation until results back recommended. Supportive cares recommended. Return precautions provided. Patient verbalized understanding and agreeable to plan.    Final Clinical Impressions(s) / UC Diagnoses   Final diagnoses:  Bad headache  Upper respiratory tract infection, unspecified type     Discharge Instructions     Self isolate until covid results are back and negative.  Will notify you by phone of any positive findings. Your negative results will be sent through your MyChart.     Over the counter medications as needed for symptoms.  Push fluids to ensure adequate hydration and keep secretions thin.  Tylenol and/or ibuprofen as needed for pain or fevers.  If symptoms worsen or do not improve in the next week to return to be seen or to follow up with your PCP.      ED Prescriptions    None     PDMP not reviewed this encounter.   Zigmund Gottron, NP 01/27/20 [phone]

## 2020-01-26 NOTE — ED Triage Notes (Signed)
Pt c/o cough, sore throat and HA x1 week. Diarrhea, abd pain, chills, SOBOE this past weekend. Pt states decreased, urine o/p with frequency/, urgency since this weekend, low back pain as well.

## 2020-01-28 LAB — NOVEL CORONAVIRUS, NAA (HOSP ORDER, SEND-OUT TO REF LAB; TAT 18-24 HRS): SARS-CoV-2, NAA: NOT DETECTED

## 2020-03-01 ENCOUNTER — Other Ambulatory Visit: Payer: Self-pay

## 2020-03-01 ENCOUNTER — Ambulatory Visit (HOSPITAL_COMMUNITY)
Admission: EM | Admit: 2020-03-01 | Discharge: 2020-03-01 | Disposition: A | Discharge status: Home / Self Care | Payer: Medicaid Other | Attending: Family Medicine | Admitting: Family Medicine

## 2020-03-01 ENCOUNTER — Encounter (HOSPITAL_COMMUNITY): Payer: Self-pay | Admitting: Emergency Medicine

## 2020-03-01 DIAGNOSIS — R350 Frequency of micturition: Secondary | ICD-10-CM

## 2020-03-01 DIAGNOSIS — R109 Unspecified abdominal pain: Secondary | ICD-10-CM

## 2020-03-01 DIAGNOSIS — Z3490 Encounter for supervision of normal pregnancy, unspecified, unspecified trimester: Secondary | ICD-10-CM

## 2020-03-01 DIAGNOSIS — Z3201 Encounter for pregnancy test, result positive: Secondary | ICD-10-CM | POA: Diagnosis not present

## 2020-03-01 DIAGNOSIS — R5383 Other fatigue: Secondary | ICD-10-CM | POA: Diagnosis not present

## 2020-03-01 LAB — POCT URINALYSIS DIP (DEVICE)
Bilirubin Urine: NEGATIVE
Glucose, UA: NEGATIVE mg/dL
Hgb urine dipstick: NEGATIVE
Ketones, ur: NEGATIVE mg/dL
Leukocytes,Ua: NEGATIVE
Nitrite: NEGATIVE
Protein, ur: NEGATIVE mg/dL
Specific Gravity, Urine: 1.025 (ref 1.005–1.030)
Urobilinogen, UA: 0.2 mg/dL (ref 0.0–1.0)
pH: 7 (ref 5.0–8.0)

## 2020-03-01 LAB — POCT PREGNANCY, URINE: Preg Test, Ur: POSITIVE — AB

## 2020-03-01 LAB — POC URINE PREG, ED: Preg Test, Ur: POSITIVE — AB

## 2020-03-01 NOTE — Discharge Instructions (Signed)
[facility]

## 2020-03-01 NOTE — ED Provider Notes (Signed)
[facility]    CSN: [id number] Arrival date & time: 03/01/20  1627      History   Chief Complaint Chief Complaint  Patient presents with  . Abdominal Pain  . Fatigue    HPI Vicki Vincent is a 44 y.o. female.   HPI  Patient is here for urinary frequency, fatigue, lower abdominal pressure.  No vaginal discharge.  No concern for STD.  She has had unprotected sexual relations.  She states that she was pregnant in December and had an elective termination of pregnancy.  Since then she has been engaging in unprotected sexual relations.  She states that they gave her 30 days of pills, but when those pills ran out she did not stay on any kind of birth control.  She does not wish to be pregnant at this time.  History reviewed. No pertinent past medical history.  There are no problems to display for this patient.   History reviewed. No pertinent surgical history.  OB History   No obstetric history on file.      Home Medications    Prior to Admission medications   Medication Sig Start Date End Date Taking? Authorizing Provider  norgestimate-ethinyl estradiol (ORTHO-CYCLEN) 0.25-35 MG-MCG tablet Take 1 tablet by mouth daily.  03/01/20  [provider]    Family History History reviewed. No pertinent family history.  Social History Social History   Tobacco Use  . Smoking status: Current Every Day Smoker    Packs/day: 0.20    Types: Cigarettes  . Smokeless tobacco: Never Used  Substance Use Topics  . Alcohol use: Yes  . Drug use: No     Allergies   Patient has no known allergies.   Review of Systems Review of Systems  Constitutional: Positive for fatigue.  Genitourinary: Positive for menstrual problem. Negative for dysuria, vaginal bleeding and vaginal discharge.     Physical Exam Triage Vital Signs ED Triage Vitals  Enc Vitals Group     BP 03/01/20 1712 118/81     Pulse Rate 03/01/20 1712 99     Resp 03/01/20 1712 18     Temp 03/01/20  1712 98.7 F (37.1 C)     Temp Source 03/01/20 1712 Oral     SpO2 03/01/20 1712 98 %     Weight --      Height --      Head Circumference --      Peak Flow --      Pain Score 03/01/20 1713 6     Pain Loc --      Pain Edu? --      Excl. in [place]? --    No data found.  Updated Vital Signs BP 118/81 (BP Location: Right Arm)   Pulse 99   Temp 98.7 F (37.1 C) (Oral)   Resp 18   LMP 02/11/2020   SpO2 98%      Physical Exam Constitutional:      General: She is not in acute distress.    Appearance: She is well-developed.  HENT:     Head: Normocephalic and atraumatic.     Mouth/Throat:     Comments: Mask in place Eyes:     Conjunctiva/sclera: Conjunctivae normal.     Pupils: Pupils are equal, round, and reactive to light.  Cardiovascular:     Rate and Rhythm: Normal rate.  Pulmonary:     Effort: Pulmonary effort is normal. No respiratory distress.  Abdominal:     General: There is  no distension.     Palpations: Abdomen is soft.     Tenderness: There is abdominal tenderness.     Comments: Mild tenderness to deep palpation in the suprapubic region.  No guarding or rebound  Musculoskeletal:        General: Normal range of motion.     Cervical back: Normal range of motion.  Skin:    General: Skin is warm and dry.  Neurological:     Mental Status: She is alert.      UC Treatments / Results  Labs (all labs ordered are listed, but only abnormal results are displayed) Labs Reviewed  POCT PREGNANCY, URINE - Abnormal; Notable for the following components:      Result Value   Preg Test, Ur POSITIVE (*)    All other components within normal limits  POC URINE PREG, ED - Abnormal; Notable for the following components:   Preg Test, Ur POSITIVE (*)    All other components within normal limits  POCT URINALYSIS DIP (DEVICE)    EKG   Radiology No results found.  Procedures Procedures (including critical care time)  Medications Ordered in UC Medications - No data to  display  Initial Impression / Assessment and Plan / UC Course  I have reviewed the triage vital signs and the nursing notes.  Pertinent labs & imaging results that were available during my care of the patient were reviewed by me and considered in my medical decision making (see chart for details).  Clinical Course as of Mar 01 1745  Mon Mar 01, 2020  1729 POCT Pregnancy, Urine [YN]    Clinical Course User Index [YN] Eustace Moore, MD    Discussed with patient she has a positive pregnancy test.  Early pregnancy.  Importance of birth control is renewed with patient.  Safe sex is discussed. Final Clinical Impressions(s) / UC Diagnoses   Final diagnoses:  Pregnancy at early stage     Discharge Instructions     [facility]   ED Prescriptions    None     PDMP not reviewed this encounter.   Eustace Moore, MD 03/01/20 [phone]

## 2020-03-01 NOTE — ED Triage Notes (Signed)
Pt here for lower abd pain and fatigue x 2 days; pt denies vaginal discharge or dysuria

## 2020-10-13 IMAGING — CT CT HEAD WITHOUT CONTRAST
4 series · 16 of 47 positions shown, 18 images · non-contrast
Comparison: None.

CLINICAL DATA: Difficult to arouse, slurred speech

EXAM:
CT HEAD WITHOUT CONTRAST
TECHNIQUE: Contiguous axial images were obtained from the base of the skull
through the vertex without intravenous contrast.

[Series 3: head without · axial · non-contrast · 0.44mm/px · z∈[+1470,+1596]mm · 7 of 35 slices shown, 9 images]
[im 5/35  brain]
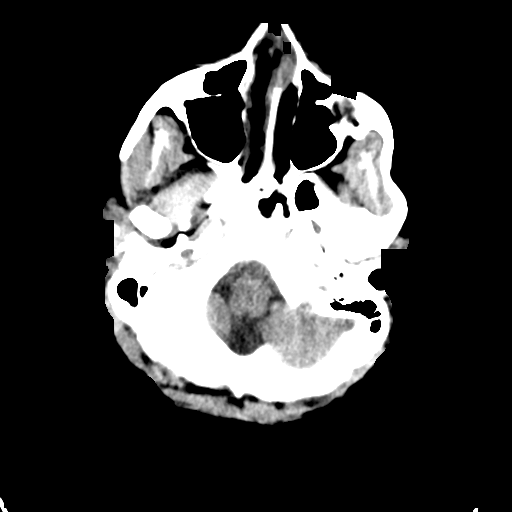
[im 5/35  bone]
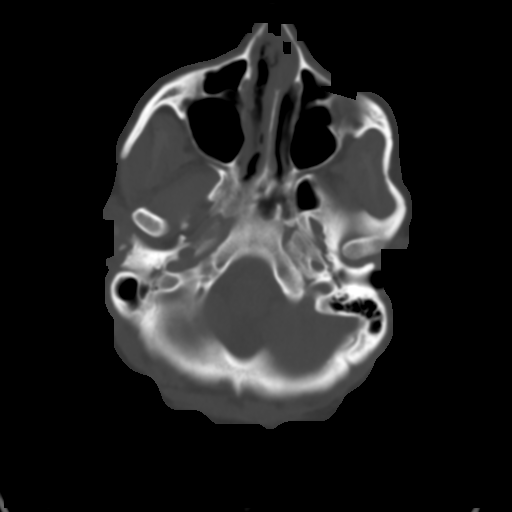
[im 9/35  brain]
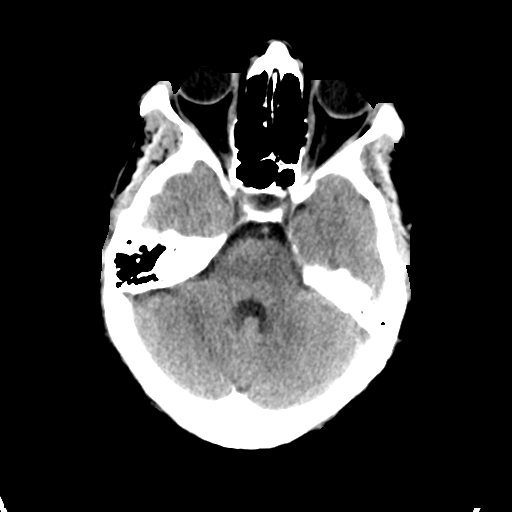
[im 13/35  brain]
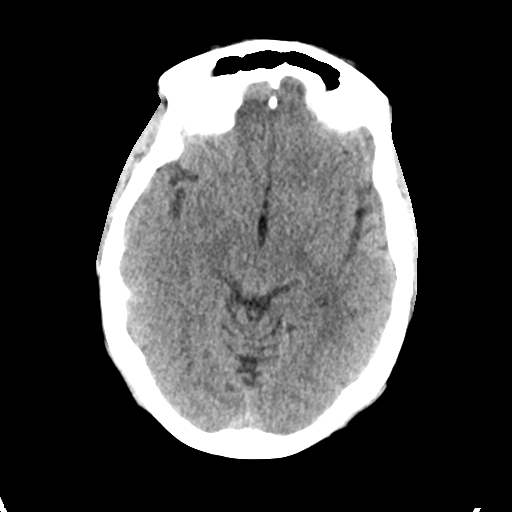
[im 18/35  brain]
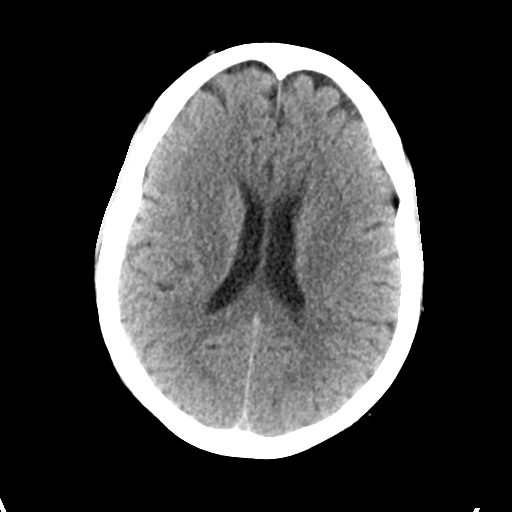
[im 22/35  brain]
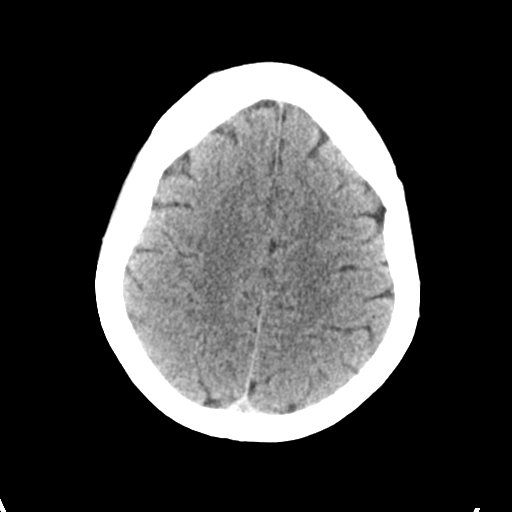
[im 22/35  bone]
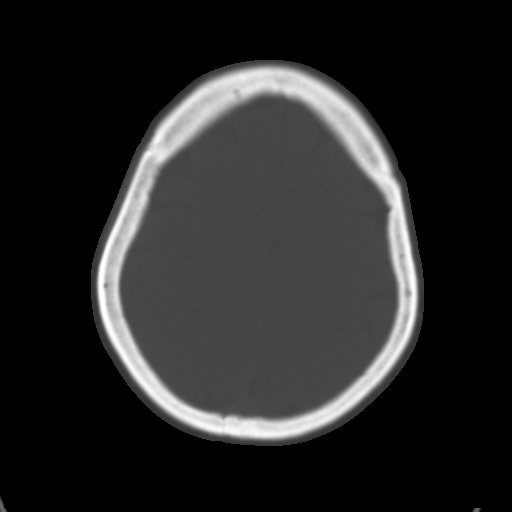
[im 26/35  brain]
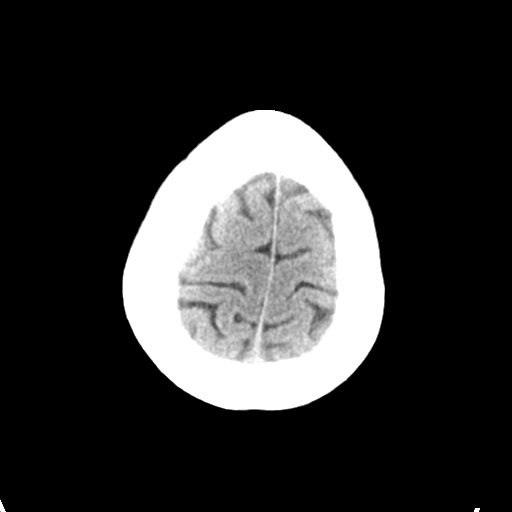
[im 30/35  brain]
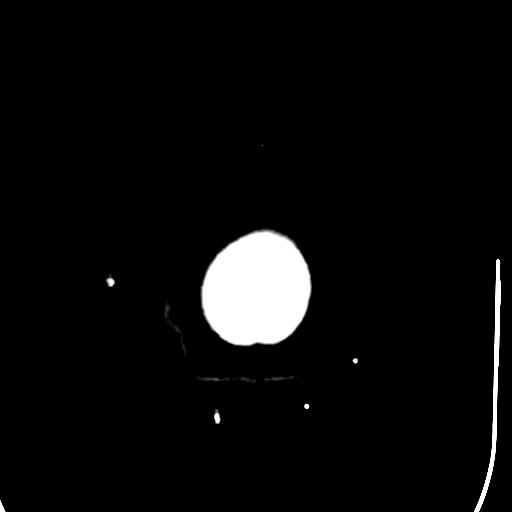

[Series 4: head bone · axial · 0.44mm/px · z∈[+1466,+1500]mm · 3 of 87 slices shown]
[im 9/87  bone]
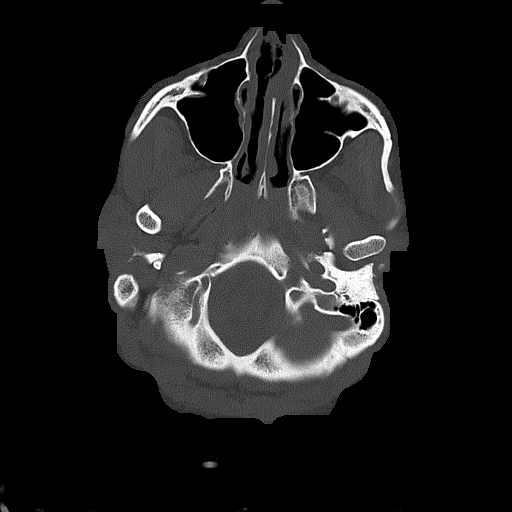
[im 18/87  bone]
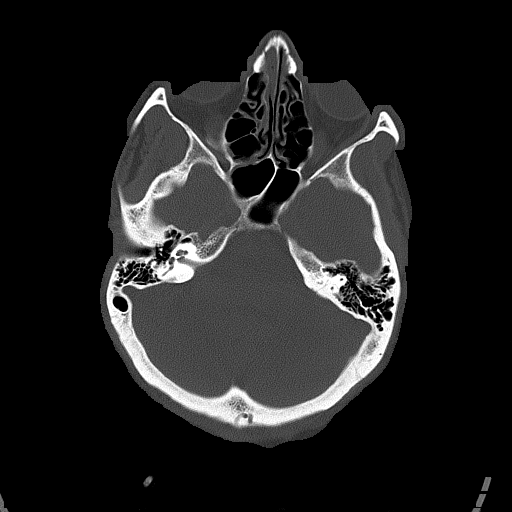
[im 26/87  bone]
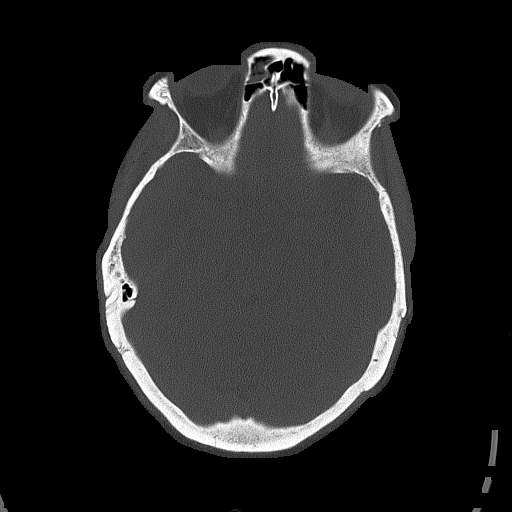

[Series 5: head without cor · coronal · non-contrast · 0.32mm/px · 3 of 69 slices shown]
[im 23/69  brain]
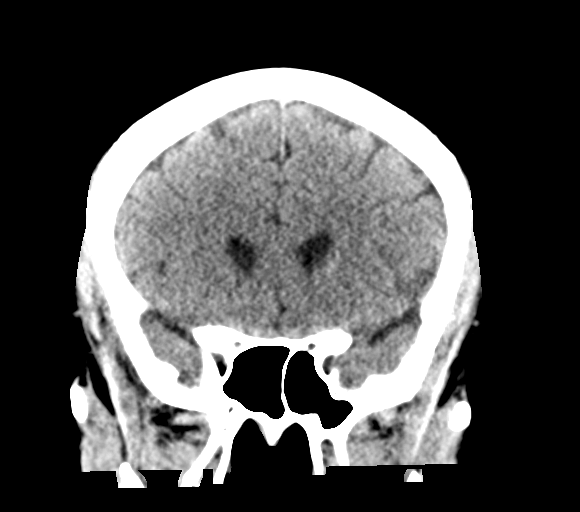
[im 31/69  brain]
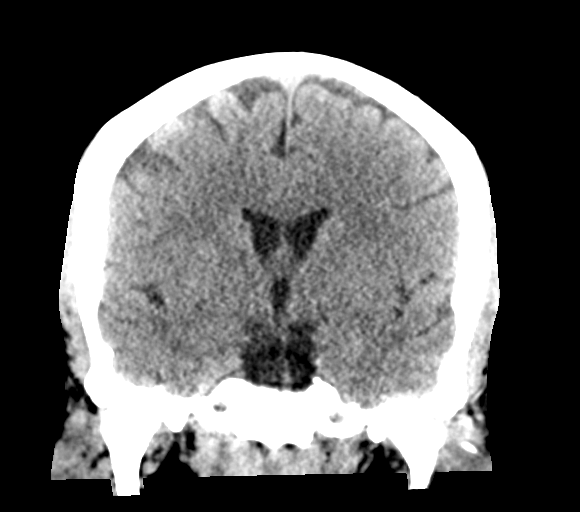
[im 38/69  brain]
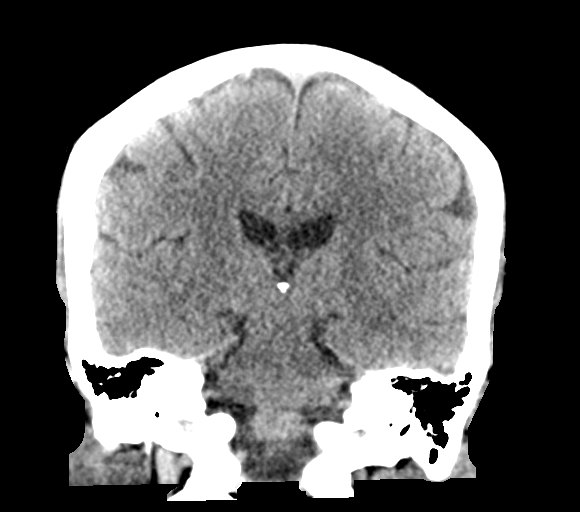

[Series 6: head without sag · sagittal · non-contrast · 0.32mm/px · 3 of 61 slices shown]
[im 21/61  brain]
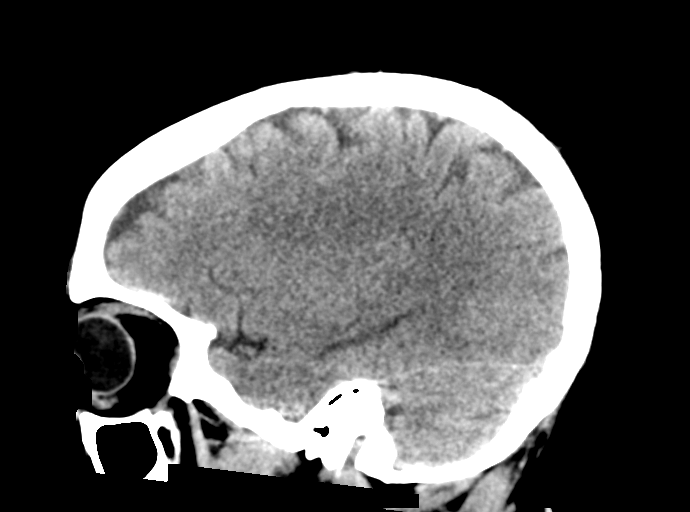
[im 31/61  brain]
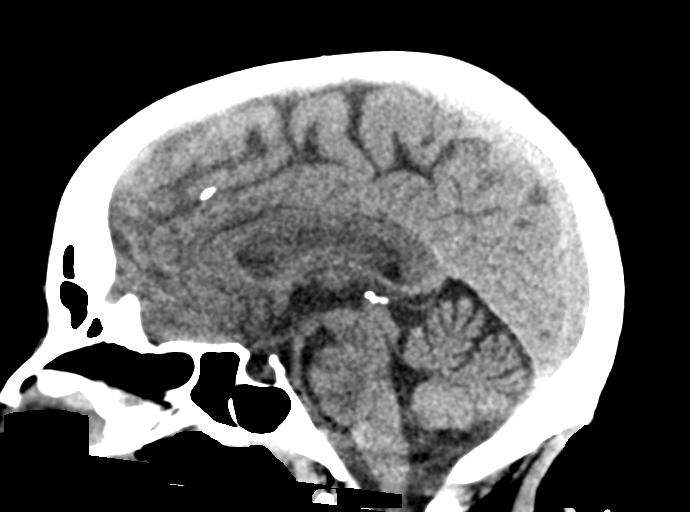
[im 41/61  brain]
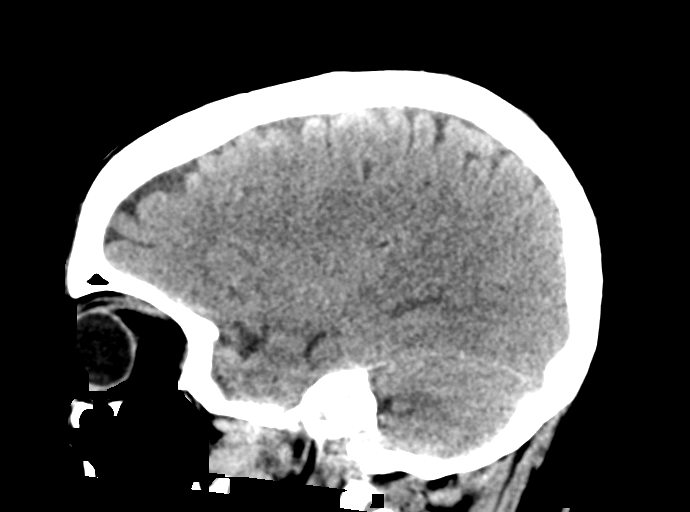

[16 of 47 positions shown; findings below may reference images not displayed]

FINDINGS: Brain: No evidence of acute infarction, hemorrhage, hydrocephalus,
extra-axial collection or mass lesion/mass effect.

Vascular: No hyperdense vessel or unexpected calcification.

Skull: Normal. Negative for fracture or focal lesion.

Sinuses/Orbits: No acute finding.

Other: None.
IMPRESSION: No acute intracranial pathology.

## 2020-12-30 ENCOUNTER — Ambulatory Visit (HOSPITAL_COMMUNITY)
Admission: EM | Admit: 2020-12-30 | Discharge: 2020-12-30 | Disposition: A | Discharge status: Home / Self Care | Payer: Medicaid Other | Attending: Family Medicine | Admitting: Family Medicine

## 2020-12-30 ENCOUNTER — Other Ambulatory Visit: Payer: Self-pay

## 2020-12-30 ENCOUNTER — Encounter (HOSPITAL_COMMUNITY): Payer: Self-pay | Admitting: Emergency Medicine

## 2020-12-30 DIAGNOSIS — U071 COVID-19: Secondary | ICD-10-CM

## 2020-12-30 NOTE — ED Triage Notes (Signed)
Pt here for cold sx onset 2 days associated w/cough, SOB, headache, fatigue  Reports home test was positive yesterday  Taking OTC Theraflu  A&O x4... NAD.Marland Kitchen. ambulatory

## 2020-12-30 NOTE — ED Provider Notes (Signed)
  [facility]   [id number] 12/30/20 Arrival Time: 1550  ASSESSMENT & PLAN:  1. COVID-19 virus infection     Work note given. OTC symptom care as needed.    Follow-up Information    [facility].   Specialty: Urgent Care Why: If worsening or failing to improve as anticipated. Contact information: [address] [place] [address] [phone]              Reviewed expectations re: course of current medical issues. Questions answered. Outlined signs and symptoms indicating need for more acute intervention. Understanding verbalized. After Visit Summary given.   SUBJECTIVE: History from: patient. Vicki Vincent is a 45 y.o. female who presents with worries regarding COVID-19. Known COVID-19 contact: none. Recent travel: none. Reports: home test + for COVID. Cough, HA, fatigue 2 days. Denies: fever. Normal PO intake without n/v/d.    OBJECTIVE:  Vitals:   12/30/20 1713  BP: 127/82  Pulse: 99  Resp: 20  Temp: 97.7 F (36.5 C)  TempSrc: Oral  SpO2: 100%    General appearance: alert; no distress Eyes: PERRLA; EOMI; conjunctiva normal HENT: [place]; AT; with nasal congestion Neck: supple  Lungs: speaks full sentences without difficulty; unlabored; dry cough Extremities: no edema Skin: warm and dry Neurologic: normal gait Psychological: alert and cooperative; normal mood and affect   No Known Allergies  History reviewed. No pertinent past medical history. Social History   Socioeconomic History  . Marital status: Single    Spouse name: Not on file  . Number of children: Not on file  . Years of education: Not on file  . Highest education level: Not on file  Occupational History  . Not on file  Tobacco Use  . Smoking status: Current Every Day Smoker    Packs/day: 0.20    Types: Cigarettes  . Smokeless tobacco: Never Used  Substance and Sexual Activity  . Alcohol use: Yes  . Drug use: No  . Sexual  activity: Not on file  Other Topics Concern  . Not on file  Social History Narrative  . Not on file   Social Determinants of Health   Financial Resource Strain: Not on file  Food Insecurity: Not on file  Transportation Needs: Not on file  Physical Activity: Not on file  Stress: Not on file  Social Connections: Not on file  Intimate Partner Violence: Not on file   History reviewed. No pertinent family history. History reviewed. No pertinent surgical history.   Mardella Layman, MD 12/30/20 2028

## 2021-01-04 ENCOUNTER — Ambulatory Visit (HOSPITAL_COMMUNITY)
Admission: EM | Admit: 2021-01-04 | Discharge: 2021-01-04 | Disposition: A | Discharge status: Home / Self Care | Payer: Medicaid Other | Attending: Family Medicine | Admitting: Family Medicine

## 2021-01-04 ENCOUNTER — Other Ambulatory Visit: Payer: Self-pay

## 2021-01-04 DIAGNOSIS — U071 COVID-19: Secondary | ICD-10-CM | POA: Insufficient documentation

## 2021-01-04 DIAGNOSIS — Z20822 Contact with and (suspected) exposure to covid-19: Secondary | ICD-10-CM

## 2021-01-04 NOTE — ED Triage Notes (Signed)
Pt is here to get tested for COVID.Marland Kitchen. seen here last week on 12/30/2020   Sts she needs a test for work  [employer] w/Dr. Tracie Harrier and he said ok just to test pt and do as a nurse visit.   A&O x4... NAD.Marland Kitchen. ambulatory

## 2021-01-05 LAB — SARS CORONAVIRUS 2 (TAT 6-24 HRS): SARS Coronavirus 2: POSITIVE — AB

## 2021-07-07 ENCOUNTER — Encounter (HOSPITAL_COMMUNITY): Payer: Self-pay | Admitting: *Deleted

## 2021-07-07 ENCOUNTER — Other Ambulatory Visit: Payer: Self-pay

## 2021-07-07 ENCOUNTER — Ambulatory Visit (HOSPITAL_COMMUNITY)
Admission: EM | Admit: 2021-07-07 | Discharge: 2021-07-07 | Disposition: A | Discharge status: Home / Self Care | Payer: Medicaid Other | Attending: Emergency Medicine | Admitting: Emergency Medicine

## 2021-07-07 DIAGNOSIS — L0291 Cutaneous abscess, unspecified: Secondary | ICD-10-CM

## 2021-07-07 MED ORDER — NAPROXEN 500 MG PO TABS: 500.0000 mg | ORAL_TABLET | Freq: Two times a day (BID) | ORAL | 0 refills | Status: DC

## 2021-07-07 MED ORDER — DOXYCYCLINE HYCLATE 100 MG PO CAPS: 100.0000 mg | ORAL_CAPSULE | Freq: Two times a day (BID) | ORAL | 0 refills | Status: AC

## 2021-07-07 NOTE — ED Provider Notes (Signed)
[facility]    CSN: [id number] Arrival date & time: 07/07/21  1511      History   Chief Complaint Chief Complaint  Patient presents with   Abscess    HPI Vicki Vincent is a 45 y.o. female.   Patient here for evaluation of abscess to left groin.  Reports history of same but denies any abscesses recently.  Does report shaving recently.  Reports trying warm compresses with minimal relief.  Denies any trauma, injury, or other precipitating event.  Denies any specific alleviating or aggravating factors.  Denies any fevers, chest pain, shortness of breath, N/V/D, numbness, tingling, weakness, abdominal pain, or headaches.    The history is provided by the patient.  Abscess  History reviewed. No pertinent past medical history.  There are no problems to display for this patient.   History reviewed. No pertinent surgical history.  OB History   No obstetric history on file.      Home Medications    Prior to Admission medications   Medication Sig Start Date End Date Taking? Authorizing Provider  doxycycline (VIBRAMYCIN) 100 MG capsule Take 1 capsule (100 mg total) by mouth 2 (two) times daily for 10 days. 07/07/21 07/17/21 Yes Ivette Loyal, NP  naproxen (NAPROSYN) 500 MG tablet Take 1 tablet (500 mg total) by mouth 2 (two) times daily. 07/07/21  Yes Ivette Loyal, NP  norgestimate-ethinyl estradiol (ORTHO-CYCLEN) 0.25-35 MG-MCG tablet Take 1 tablet by mouth daily.  03/01/20  [provider]    Family History History reviewed. No pertinent family history.  Social History Social History   Tobacco Use   Smoking status: Every Day    Packs/day: 0.20    Types: Cigarettes   Smokeless tobacco: Never  Substance Use Topics   Alcohol use: Yes   Drug use: No     Allergies   Patient has no known allergies.   Review of Systems Review of Systems  Skin:  Positive for wound.  All other systems reviewed and are negative.   Physical Exam Triage Vital  Signs ED Triage Vitals  Enc Vitals Group     BP 07/07/21 1628 137/77     Pulse Rate 07/07/21 1628 (!) 108     Resp 07/07/21 1628 20     Temp 07/07/21 1628 98.4 F (36.9 C)     Temp src --      SpO2 07/07/21 1628 100 %     Weight --      Height --      Head Circumference --      Peak Flow --      Pain Score 07/07/21 1630 10     Pain Loc --      Pain Edu? --      Excl. in [place]? --    No data found.  Updated Vital Signs BP 137/77   Pulse (!) 108   Temp 98.4 F (36.9 C)   Resp 20   LMP 06/07/2021 (Approximate)   SpO2 100%   Visual Acuity Right Eye Distance:   Left Eye Distance:   Bilateral Distance:    Right Eye Near:   Left Eye Near:    Bilateral Near:     Physical Exam Vitals and nursing note reviewed.  Constitutional:      General: She is not in acute distress.    Appearance: Normal appearance. She is not ill-appearing, toxic-appearing or diaphoretic.  HENT:     Head: Normocephalic and atraumatic.  Eyes:  Conjunctiva/sclera: Conjunctivae normal.  Cardiovascular:     Rate and Rhythm: Normal rate.     Pulses: Normal pulses.  Pulmonary:     Effort: Pulmonary effort is normal.  Abdominal:     General: Abdomen is flat.  Genitourinary:   Musculoskeletal:        General: Normal range of motion.     Cervical back: Normal range of motion.  Skin:    General: Skin is warm and dry.     Findings: Abscess (left lower labia) present.  Neurological:     General: No focal deficit present.     Mental Status: She is alert and oriented to person, place, and time.  Psychiatric:        Mood and Affect: Mood normal.     UC Treatments / Results  Labs (all labs ordered are listed, but only abnormal results are displayed) Labs Reviewed - No data to display  EKG   Radiology No results found.  Procedures Procedures (including critical care time)  Medications Ordered in UC Medications - No data to display  Initial Impression / Assessment and Plan / UC  Course  I have reviewed the triage vital signs and the nursing notes.  Pertinent labs & imaging results that were available during my care of the patient were reviewed by me and considered in my medical decision making (see chart for details).    Assessment negative for red flags or concerns.  Immature abscess to left groin/labia.  I&D not recommended at this time.  We will treat with doxycycline 1 pill twice a day for the next 10 days.  Recommend applying a warm compress 3-4 times a day.  Naproxen twice a day as needed for pain.  May also take Tylenol and/or ibuprofen.  Return for any worsening symptoms.  Follow-up with primary care as needed. Final Clinical Impressions(s) / UC Diagnoses   Final diagnoses:  Abscess     Discharge Instructions      Take the doxycycline 1 pill twice a day for the next 10 days.  Apply a warm compress to the areas 3-4 times a day. You can take Tylenol and/or ibuprofen as needed for pain relief and fever reduction.  Return for reevaluation for any worsening symptoms including worsening redness, swelling, red streaks, or fevers.  Return or go to the Emergency Department if symptoms worsen or do not improve in the next few days.       ED Prescriptions     Medication Sig Dispense Auth. Provider   doxycycline (VIBRAMYCIN) 100 MG capsule Take 1 capsule (100 mg total) by mouth 2 (two) times daily for 10 days. 20 capsule Ivette Loyal, NP   naproxen (NAPROSYN) 500 MG tablet Take 1 tablet (500 mg total) by mouth 2 (two) times daily. 30 tablet Ivette Loyal, NP      PDMP not reviewed this encounter.   Ivette Loyal, NP 07/07/21 1655

## 2021-07-07 NOTE — Discharge Instructions (Addendum)
Take the doxycycline 1 pill twice a day for the next 10 days.  Apply a warm compress to the areas 3-4 times a day. You can take Tylenol and/or ibuprofen as needed for pain relief and fever reduction.  Return for reevaluation for any worsening symptoms including worsening redness, swelling, red streaks, or fevers.  Return or go to the Emergency Department if symptoms worsen or do not improve in the next few days.

## 2021-07-07 NOTE — ED Triage Notes (Signed)
Pt reports an abscess to Lt groin.

## 2021-08-15 ENCOUNTER — Other Ambulatory Visit (HOSPITAL_COMMUNITY)
Admission: RE | Admit: 2021-08-15 | Discharge: 2021-08-15 | Disposition: A | Discharge status: Home / Self Care | Payer: Medicaid Other | Source: Ambulatory Visit | Attending: Nurse Practitioner | Admitting: Nurse Practitioner

## 2021-08-15 ENCOUNTER — Ambulatory Visit (INDEPENDENT_AMBULATORY_CARE_PROVIDER_SITE_OTHER): Payer: Medicaid Other | Admitting: Nurse Practitioner

## 2021-08-15 ENCOUNTER — Other Ambulatory Visit: Payer: Self-pay

## 2021-08-15 ENCOUNTER — Encounter: Payer: Self-pay | Admitting: Nurse Practitioner

## 2021-08-15 VITALS — BP 130/84 | Ht 69.0 in | Wt 258.1 lb

## 2021-08-15 DIAGNOSIS — Z01419 Encounter for gynecological examination (general) (routine) without abnormal findings: Secondary | ICD-10-CM | POA: Diagnosis not present

## 2021-08-15 DIAGNOSIS — R300 Dysuria: Secondary | ICD-10-CM

## 2021-08-15 DIAGNOSIS — F17200 Nicotine dependence, unspecified, uncomplicated: Secondary | ICD-10-CM | POA: Diagnosis not present

## 2021-08-15 DIAGNOSIS — Z30011 Encounter for initial prescription of contraceptive pills: Secondary | ICD-10-CM

## 2021-08-15 DIAGNOSIS — Z6838 Body mass index (BMI) 38.0-38.9, adult: Secondary | ICD-10-CM | POA: Diagnosis not present

## 2021-08-15 MED ORDER — NORETHINDRONE 0.35 MG PO TABS: 1.0000 | ORAL_TABLET | Freq: Every day | ORAL | 11 refills | Status: DC

## 2021-08-15 NOTE — Progress Notes (Signed)
GYNECOLOGY ANNUAL PREVENTATIVE CARE ENCOUNTER NOTE  Subjective:   Vicki Vincent is a 45 y.o. No obstetric history on file. female here for a routine annual gynecologic exam.  Current complaints: at ER in July for swollen area on labia.  Hx of hidradenitis with surgery under her arms.  Thinks she may have a UTI today - dysuria.   Denies abnormal vaginal bleeding, discharge, pelvic pain, problems with intercourse or other gynecologic concerns.  Has noticed that menses are 4-5 days and she does have cramping and she takes Aleve but still has to use a heating pad as the cramps are severe.   Gynecologic History No LMP recorded. Contraception: oral progesterone-only contraceptive Last Pap: unknown.  Last mammogram: unknown.  Obstetric History OB History  No obstetric history on file.    No past medical history on file.  No past surgical history on file.  Current Outpatient Medications on File Prior to Visit  Medication Sig Dispense Refill   naproxen (NAPROSYN) 500 MG tablet Take 1 tablet (500 mg total) by mouth 2 (two) times daily. 30 tablet 0   [DISCONTINUED] norgestimate-ethinyl estradiol (ORTHO-CYCLEN) 0.25-35 MG-MCG tablet Take 1 tablet by mouth daily.     No current facility-administered medications on file prior to visit.    No Known Allergies  Social History   Socioeconomic History   Marital status: Single    Spouse name: Not on file   Number of children: Not on file   Years of education: Not on file   Highest education level: Not on file  Occupational History   Not on file  Tobacco Use   Smoking status: Every Day    Packs/day: 0.20    Types: Cigarettes   Smokeless tobacco: Never  Substance and Sexual Activity   Alcohol use: Yes   Drug use: No   Sexual activity: Not on file  Other Topics Concern   Not on file  Social History Narrative   Not on file   Social Determinants of Health   Financial Resource Strain: Not on file  Food Insecurity: Not on file   Transportation Needs: Not on file  Physical Activity: Not on file  Stress: Not on file  Social Connections: Not on file  Intimate Partner Violence: Not on file    No family history on file.  The following portions of the patient's history were reviewed and updated as appropriate: allergies, current medications, past family history, past medical history, past social history, past surgical history and problem list.  Review of Systems Pertinent items noted in HPI and remainder of comprehensive ROS otherwise negative.   Objective:  BP 130/84   Ht 5\' 9"  (1.753 m)   Wt 258 lb 1.6 oz (117.1 kg)   BMI 38.11 kg/m  CONSTITUTIONAL: Well-developed, well-nourished female in no acute distress.  HENT:  Normocephalic, atraumatic, External right and left ear normal.  EYES: Conjunctivae and EOM are normal. Pupils are equal, round.  No scleral icterus.  NECK: Normal range of motion, supple, no masses.  Normal thyroid.  SKIN: Skin is warm and dry. No rash noted. Not diaphoretic. No erythema. No pallor. NEUROLOGIC: Alert and oriented to person, place, and time. Normal reflexes, muscle tone coordination. No cranial nerve deficit noted. PSYCHIATRIC: Normal mood and affect. Normal behavior. Normal judgment and thought content. CARDIOVASCULAR: Normal heart rate noted, regular rhythm RESPIRATORY: Clear to auscultation bilaterally. Effort and breath sounds normal, no problems with respiration noted. BREASTS: Symmetric in size. No masses, skin changes, nipple drainage, or  lymphadenopathy. ABDOMEN: Soft, no distention noted.  No tenderness, rebound or guarding.  PELVIC: Normal appearing external genitalia; No swollen areas seen externally.  Normal appearing vaginal mucosa and cervix.  Small amount of yellow discharge noted - odor detected.  Pap smear obtained.  Normal uterine size, no other palpable masses, no uterine or adnexal tenderness. MUSCULOSKELETAL: Normal range of motion. No tenderness.  No cyanosis,  clubbing, or edema.    Assessment and Plan:  1. Well woman exam Had ER visit in July for swollen area on labia - has a history of hidradenitis and has had surgery on her underarms.  Was given antibiotics and client was requested a prescription for antibiotics but advised her to come in with any swollen areas so further evaluation can be done rather than beginning antibiotics automatically Reviewed appointment for mammogram BP normal Urinalysis done as reported having dysuria and thinks she has a UTI - dipstick does not indicated any infection- other labs pending with pap to check for vaginal infection  - Cytology - PAP( [facility]) - MM 3D SCREEN BREAST BILATERAL; Future  2. BMI 38.0-38.9,adult Weight loss by healthy eating and increasing physical activity recommended  3. Current every day smoker Smokes one cigarette daily Advised to consider quitting but is not really interested today  4. OCP (oral contraceptive pills) initiation Prescribed progestin only pills - current smoker  Advised to set alarm on phone to remember to take pills daily at the same time. Start pills this week  Will follow up results of pap smear and manage accordingly. Mammogram scheduled Routine preventative health maintenance measures emphasized. Please refer to After Visit Summary for other counseling recommendations.    Nolene Bernheim, RN, MSN, NP-BC Nurse Practitioner, [facility]

## 2021-08-16 LAB — CYTOLOGY - PAP
Chlamydia: NEGATIVE
Comment: NEGATIVE
Comment: NEGATIVE
Comment: NEGATIVE
Comment: NORMAL
Diagnosis: NEGATIVE
High risk HPV: NEGATIVE
Neisseria Gonorrhea: NEGATIVE
Trichomonas: NEGATIVE

## 2021-08-16 LAB — POCT URINALYSIS DIP (DEVICE)
Bilirubin Urine: NEGATIVE
Glucose, UA: NEGATIVE mg/dL
Ketones, ur: NEGATIVE mg/dL
Leukocytes,Ua: NEGATIVE
Nitrite: NEGATIVE
Protein, ur: NEGATIVE mg/dL
Specific Gravity, Urine: 1.025 (ref 1.005–1.030)
Urobilinogen, UA: 0.2 mg/dL (ref 0.0–1.0)
pH: 7 (ref 5.0–8.0)

## 2021-09-11 ENCOUNTER — Encounter (HOSPITAL_COMMUNITY): Payer: Self-pay

## 2021-09-11 ENCOUNTER — Ambulatory Visit (HOSPITAL_COMMUNITY)
Admission: EM | Admit: 2021-09-11 | Discharge: 2021-09-11 | Disposition: A | Discharge status: Home / Self Care | Payer: Medicaid Other | Attending: Emergency Medicine | Admitting: Emergency Medicine

## 2021-09-11 DIAGNOSIS — N764 Abscess of vulva: Secondary | ICD-10-CM

## 2021-09-11 DIAGNOSIS — L02411 Cutaneous abscess of right axilla: Secondary | ICD-10-CM

## 2021-09-11 MED ORDER — DOXYCYCLINE HYCLATE 100 MG PO CAPS: 100.0000 mg | ORAL_CAPSULE | Freq: Two times a day (BID) | ORAL | 0 refills | Status: DC

## 2021-09-11 MED ORDER — TRAMADOL HCL 50 MG PO TABS: 50.0000 mg | ORAL_TABLET | Freq: Four times a day (QID) | ORAL | 0 refills | Status: DC | PRN

## 2021-09-11 NOTE — ED Provider Notes (Signed)
[facility]    CSN: [id number] Arrival date & time: 09/11/21  1221      History   Chief Complaint Chief Complaint  Patient presents with   Abscess    HPI Vicki Vincent is a 45 y.o. female.   HPI Vicki Vincent is a 45 y.o. female presenting to UC with c/o recurrent abscesses with one in Right axilla and one on Left labia today.  Symptoms started a 3-4 days ago with gradually worsening pain and swelling.  She notes having an abscess in the same area on her labia about 2 months ago, resolved after taking doxycycline.  She does not drinking more soda recently and questions if that has contributed to these more recent skin infections. Denies drainage.  Denies fever, chills, n/v/d. She does not think either need to be drained at this time but hopes to get an antibiotic to help the pain and infection.   History reviewed. No pertinent past medical history.  Patient Active Problem List   Diagnosis Date Noted   BMI 38.0-38.9,adult 08/15/2021   Current every day smoker 08/15/2021    History reviewed. No pertinent surgical history.  OB History   No obstetric history on file.      Home Medications    Prior to Admission medications   Medication Sig Start Date End Date Taking? Authorizing Provider  traMADol (ULTRAM) 50 MG tablet Take 1 tablet (50 mg total) by mouth every 6 (six) hours as needed. 09/11/21  Yes Adalyna Godbee O, PA-C  doxycycline (VIBRAMYCIN) 100 MG capsule Take 1 capsule (100 mg total) by mouth 2 (two) times daily. 09/11/21   Lurene Shadow, PA-C  naproxen (NAPROSYN) 500 MG tablet Take 1 tablet (500 mg total) by mouth 2 (two) times daily. 07/07/21   Ivette Loyal, NP  norethindrone (MICRONOR) 0.35 MG tablet Take 1 tablet (0.35 mg total) by mouth daily. 08/15/21   Currie Paris, NP  norgestimate-ethinyl estradiol (ORTHO-CYCLEN) 0.25-35 MG-MCG tablet Take 1 tablet by mouth daily.  03/01/20  [provider]    Family History History reviewed. No  pertinent family history.  Social History Social History   Tobacco Use   Smoking status: Every Day    Packs/day: 0.20    Types: Cigarettes   Smokeless tobacco: Never  Substance Use Topics   Alcohol use: Yes   Drug use: No     Allergies   Patient has no known allergies.   Review of Systems Review of Systems  Constitutional:  Negative for chills and fever.  Skin:  Positive for color change. Negative for wound.    Physical Exam Triage Vital Signs ED Triage Vitals  Enc Vitals Group     BP 09/11/21 1304 124/88     Pulse Rate 09/11/21 1304 83     Resp 09/11/21 1304 18     Temp 09/11/21 1304 98.2 F (36.8 C)     Temp Source 09/11/21 1304 Oral     SpO2 09/11/21 1304 98 %     Weight --      Height --      Head Circumference --      Peak Flow --      Pain Score 09/11/21 1305 6     Pain Loc --      Pain Edu? --      Excl. in GC? --    No data found.  Updated Vital Signs BP 124/88 (BP Location: Left Arm)   Pulse 83   Temp  98.2 F (36.8 C) (Oral)   Resp 18   LMP 09/02/2021   SpO2 98%   Visual Acuity Right Eye Distance:   Left Eye Distance:   Bilateral Distance:    Right Eye Near:   Left Eye Near:    Bilateral Near:     Physical Exam Vitals and nursing note reviewed.  Constitutional:      General: She is not in acute distress.    Appearance: Normal appearance. She is well-developed. She is not ill-appearing.  HENT:     Head: Normocephalic and atraumatic.  Cardiovascular:     Rate and Rhythm: Normal rate.  Pulmonary:     Effort: Pulmonary effort is normal.  Musculoskeletal:        General: Normal range of motion.     Cervical back: Normal range of motion.  Skin:    General: Skin is warm and dry.     Findings: Erythema present.          Comments: Right axilla: 2cm area of faint erythema, tenderness and minimal fluctuance, overlying well healed incision scar.  Skin in tact. No red streaking.  Left labia major/inguinal fold: 2-3cm area of erythema,  tenderness, mild fluctuance. Skin in tact. No red streaking.   Neurological:     Mental Status: She is alert and oriented to person, place, and time.  Psychiatric:        Behavior: Behavior normal.     UC Treatments / Results  Labs (all labs ordered are listed, but only abnormal results are displayed) Labs Reviewed - No data to display  EKG   Radiology No results found.  Procedures Procedures (including critical care time)  Medications Ordered in UC Medications - No data to display  Initial Impression / Assessment and Plan / UC Course  I have reviewed the triage vital signs and the nursing notes.  Pertinent labs & imaging results that were available during my care of the patient were reviewed by me and considered in my medical decision making (see chart for details).     Immature abscess of Right axilla and Left labia. I&D not indicated at this time.   Rx: tramadol and doxycycline  Home care instructions discussed, AVS provided.   Final Clinical Impressions(s) / UC Diagnoses   Final diagnoses:  Abscess of right axilla  Left genital labial abscess     Discharge Instructions       Please take antibiotics as prescribed and be sure to complete entire course even if you start to feel better to ensure infection does not come back.  Tramadol is strong pain medication. While taking, do not drink alcohol, drive, or perform any other activities that requires focus while taking these medications.   You may take 500mg  acetaminophen every 4-6 hours or in combination with ibuprofen 400-600mg  every 6-8 hours as needed for pain, inflammation, and fever.        ED Prescriptions     Medication Sig Dispense Auth. Provider   traMADol (ULTRAM) 50 MG tablet Take 1 tablet (50 mg total) by mouth every 6 (six) hours as needed. 10 tablet O, PA-C   doxycycline (VIBRAMYCIN) 100 MG capsule  (Status: Discontinued) Take 1 capsule (100 mg total) by mouth 2 (two) times  daily. 20 capsule Waylan Rocher O, PA-C   doxycycline (VIBRAMYCIN) 100 MG capsule Take 1 capsule (100 mg total) by mouth 2 (two) times daily. 20 capsule Waylan Rocher, Lurene Shadow      I have reviewed the  PDMP during this encounter.   Lurene Shadow, [place] 09/11/21 1444

## 2021-09-11 NOTE — Discharge Instructions (Signed)
  Please take antibiotics as prescribed and be sure to complete entire course even if you start to feel better to ensure infection does not come back.  Tramadol is strong pain medication. While taking, do not drink alcohol, drive, or perform any other activities that requires focus while taking these medications.   You may take 500mg  acetaminophen every 4-6 hours or in combination with ibuprofen 400-600mg  every 6-8 hours as needed for pain, inflammation, and fever.

## 2021-09-11 NOTE — ED Triage Notes (Signed)
Pt presents with recurring abscess in vaginal area and under right arm.

## 2021-09-27 ENCOUNTER — Ambulatory Visit: Payer: Medicaid Other

## 2021-09-28 ENCOUNTER — Encounter (HOSPITAL_COMMUNITY): Payer: Self-pay | Admitting: Emergency Medicine

## 2021-09-28 ENCOUNTER — Ambulatory Visit (HOSPITAL_COMMUNITY)
Admission: EM | Admit: 2021-09-28 | Discharge: 2021-09-28 | Disposition: A | Discharge status: Home / Self Care | Payer: Medicaid Other | Attending: Emergency Medicine | Admitting: Emergency Medicine

## 2021-09-28 DIAGNOSIS — N898 Other specified noninflammatory disorders of vagina: Secondary | ICD-10-CM | POA: Diagnosis not present

## 2021-09-28 DIAGNOSIS — N390 Urinary tract infection, site not specified: Secondary | ICD-10-CM | POA: Insufficient documentation

## 2021-09-28 LAB — POCT URINALYSIS DIPSTICK, ED / UC
Glucose, UA: NEGATIVE mg/dL
Ketones, ur: NEGATIVE mg/dL
Nitrite: NEGATIVE
Protein, ur: 100 mg/dL — AB
Specific Gravity, Urine: >=1.03 (ref 1.005–1.030)
Urobilinogen, UA: 0.2 mg/dL (ref 0.0–1.0)
pH: 6 (ref 5.0–8.0)

## 2021-09-28 LAB — POC URINE PREG, ED: Preg Test, Ur: NEGATIVE

## 2021-09-28 MED ORDER — TERCONAZOLE 0.4 % VA CREA: TOPICAL_CREAM | VAGINAL | 0 refills | Status: DC

## 2021-09-28 MED ORDER — FLUCONAZOLE 150 MG PO TABS: ORAL_TABLET | ORAL | 0 refills | Status: DC

## 2021-09-28 MED ORDER — SULFAMETHOXAZOLE-TRIMETHOPRIM 800-160 MG PO TABS: 1.0000 | ORAL_TABLET | Freq: Two times a day (BID) | ORAL | 0 refills | Status: AC

## 2021-09-28 MED ORDER — CEFTRIAXONE SODIUM 1 G IJ SOLR
INTRAMUSCULAR | Status: AC
  Filled 2021-09-28: qty 10

## 2021-09-28 MED ORDER — CEFTRIAXONE SODIUM 1 G IJ SOLR
1.0000 g | Freq: Once | INTRAMUSCULAR | Status: AC
  Administered 2021-09-28: 1 g via INTRAMUSCULAR

## 2021-09-28 NOTE — ED Triage Notes (Signed)
Pt presents with dysuria and swelling in hand and feet.

## 2021-09-28 NOTE — ED Provider Notes (Signed)
[facility]    CSN: [id number] Arrival date & time: 09/28/21  1018      History   Chief Complaint Chief Complaint  Patient presents with   Dysuria   Extremity swelling   Vaginal Itching    HPI Vicki Vincent is a 45 y.o. female.   Patient complains of burning with urination that began approximately 5 days ago.  Patient states that yesterday her discomfort intensified and she is also having a lot of pelvic pressure and suprapubic pain that radiates to her back.  Patient also complains of vaginal discharge, states that is thick and white, states that has an unusual odor and significant amount of vaginal itching which is also causing a lot of of burning in her vulvovaginal area.  Patient demonstrates significant inability to sit square on her bottom during visit today.  Incidentally, patient complains of swelling in her hands and feet, states she ate dinner at [employer] last night and had 2 servings of ham along with side dishes.  Blood pressure today is stable heart rate is elevated.  The history is provided by the patient.   History reviewed. No pertinent past medical history.  Patient Active Problem List   Diagnosis Date Noted   BMI 38.0-38.9,adult 08/15/2021   Current every day smoker 08/15/2021    History reviewed. No pertinent surgical history.  OB History   No obstetric history on file.      Home Medications    Prior to Admission medications   Medication Sig Start Date End Date Taking? Authorizing Provider  fluconazole (DIFLUCAN) 150 MG tablet Take 1 tablet today.  Take second tablet 3 days later. 09/28/21  Yes Theadora Rama Scales, PA-C  sulfamethoxazole-trimethoprim (BACTRIM DS) 800-160 MG tablet Take 1 tablet by mouth 2 (two) times daily for 10 days. 09/28/21 10/08/21 Yes Theadora Rama Scales, PA-C  terconazole (TERAZOL 7) 0.4 % vaginal cream Apply to affected area twice daily and each time after you urinate as needed for itching and irritation.  09/28/21  Yes Theadora Rama Scales, PA-C  norethindrone (MICRONOR) 0.35 MG tablet Take 1 tablet (0.35 mg total) by mouth daily. 08/15/21   Currie Paris, NP  norgestimate-ethinyl estradiol (ORTHO-CYCLEN) 0.25-35 MG-MCG tablet Take 1 tablet by mouth daily.  03/01/20  [provider]    Family History History reviewed. No pertinent family history.  Social History Social History   Tobacco Use   Smoking status: Every Day    Packs/day: 0.20    Types: Cigarettes   Smokeless tobacco: Never  Substance Use Topics   Alcohol use: Yes   Drug use: No     Allergies   Patient has no known allergies.   Review of Systems Review of Systems Pertinent findings noted in history of present illness.    Physical Exam Triage Vital Signs ED Triage Vitals [09/28/21 1129]  Enc Vitals Group     BP      Pulse      Resp      Temp      Temp src      SpO2      Weight      Height      Head Circumference      Peak Flow      Pain Score 10     Pain Loc      Pain Edu?      Excl. in GC?    No data found.  Updated Vital Signs LMP 09/02/2021   Visual Acuity  Right Eye Distance:   Left Eye Distance:   Bilateral Distance:    Right Eye Near:   Left Eye Near:    Bilateral Near:     Physical Exam Vitals and nursing note reviewed.  Constitutional:      General: She is in acute distress.     Appearance: Normal appearance. She is obese. She is ill-appearing. She is not toxic-appearing or diaphoretic.  HENT:     Head: Normocephalic and atraumatic.     Right Ear: Tympanic membrane, ear canal and external ear normal.     Left Ear: Tympanic membrane, ear canal and external ear normal.     Nose: Nose normal.     Mouth/Throat:     Mouth: Mucous membranes are moist.     Pharynx: Oropharynx is clear.  Eyes:     Extraocular Movements: Extraocular movements intact.     Conjunctiva/sclera: Conjunctivae normal.     Pupils: Pupils are equal, round, and reactive to light.  Cardiovascular:      Rate and Rhythm: Regular rhythm. Tachycardia present.     Pulses: Normal pulses.     Heart sounds: Normal heart sounds.  Pulmonary:     Effort: Pulmonary effort is normal.     Breath sounds: Normal breath sounds.  Abdominal:     General: Abdomen is flat. Bowel sounds are normal.     Palpations: Abdomen is soft.     Tenderness: There is abdominal tenderness (Suprapubic).  Musculoskeletal:        General: Normal range of motion.     Cervical back: Normal range of motion and neck supple.  Skin:    General: Skin is warm and dry.  Neurological:     General: No focal deficit present.     Mental Status: She is alert and oriented to person, place, and time. Mental status is at baseline.  Psychiatric:        Mood and Affect: Mood normal.        Behavior: Behavior normal.     Comments: Anxious     UC Treatments / Results  Labs (all labs ordered are listed, but only abnormal results are displayed) Labs Reviewed  POCT URINALYSIS DIPSTICK, ED / UC - Abnormal; Notable for the following components:      Result Value   Bilirubin Urine SMALL (*)    Hgb urine dipstick LARGE (*)    Protein, ur 100 (*)    Leukocytes,Ua TRACE (*)    All other components within normal limits  URINE CULTURE  POC URINE PREG, ED  CERVICOVAGINAL ANCILLARY ONLY    EKG   Radiology No results found.  Procedures Procedures (including critical care time)  Medications Ordered in UC Medications  cefTRIAXone (ROCEPHIN) injection 1 g (1 g Intramuscular Given 09/28/21 1205)    Initial Impression / Assessment and Plan / UC Course  I have reviewed the triage vital signs and the nursing notes.  Pertinent labs & imaging results that were available during my care of the patient were reviewed by me and considered in my medical decision making (see chart for details).  Based on results of urinalysis, patient presentation, patient history and my physical exam findings, will treat patient for complicated urinary  tract infection empirically now while we wait for the results of urine culture.  She received a injection of ceftriaxone 1 g during her visit today and a prescription for Bactrim x10 days.  Patient was advised that if her symptoms resolve completely after 7 days  she can discontinue Bactrim as long as she finishes the full 7 days.  Patient was advised to go to the emergency room should she have no improvement of her symptoms in the next 48 hours or if her symptoms should worsen she may require IV antibiotics.  Patient verbalized understanding and agreement of plan as discussed.  All questions were addressed during visit.  Final Clinical Impressions(s) / UC Diagnoses   Final diagnoses:  Lower urinary tract infectious disease  Complicated urinary tract infection  Vaginal discharge  Vaginal pruritus     Discharge Instructions      You were treated today with an injection of ceftriaxone for complicated lower urinary tract infectious disease.  Please also pick up a prescription for Bactrim which is an antibiotic to continue treatment for the next 10 days.  If you feel better after 7 days you can discontinue but please do finish all 7 days.  I have also sent your prescription to treat you for presumed vaginal yeast infection called Diflucan, take 1 tablet today and take second tablet in 3 days.  For your comfort I have also prescribed you an cream called terconazole, you can use this in your external vaginal area twice daily and after every time you go to the bathroom as needed.  Please go to the emergency room for worsening symptoms or symptoms that do not respond to treatment.     ED Prescriptions     Medication Sig Dispense Auth. Provider   sulfamethoxazole-trimethoprim (BACTRIM DS) 800-160 MG tablet Take 1 tablet by mouth 2 (two) times daily for 10 days. 14 tablet Theadora Rama Scales, PA-C   fluconazole (DIFLUCAN) 150 MG tablet Take 1 tablet today.  Take second tablet 3 days later. 2 tablet  Theadora Rama Scales, PA-C   terconazole (TERAZOL 7) 0.4 % vaginal cream Apply to affected area twice daily and each time after you urinate as needed for itching and irritation. 45 g Theadora Rama Scales, PA-C      PDMP not reviewed this encounter.   Theadora Rama Scales, PA-C 09/28/21 1246

## 2021-09-28 NOTE — Discharge Instructions (Addendum)
You were treated today with an injection of ceftriaxone for complicated lower urinary tract infectious disease.  Please also pick up a prescription for Bactrim which is an antibiotic to continue treatment for the next 10 days.  If you feel better after 7 days you can discontinue but please do finish all 7 days.  I have also sent your prescription to treat you for presumed vaginal yeast infection called Diflucan, take 1 tablet today and take second tablet in 3 days.  For your comfort I have also prescribed you an cream called terconazole, you can use this in your external vaginal area twice daily and after every time you go to the bathroom as needed.  Please go to the emergency room for worsening symptoms or symptoms that do not respond to treatment.

## 2021-09-29 LAB — URINE CULTURE: Culture: <10000 — AB

## 2021-09-30 LAB — CERVICOVAGINAL ANCILLARY ONLY
Bacterial Vaginitis (gardnerella): NEGATIVE
Candida Glabrata: NEGATIVE
Candida Vaginitis: NEGATIVE
Chlamydia: NEGATIVE
Comment: NEGATIVE
Comment: NEGATIVE
Comment: NEGATIVE
Comment: NEGATIVE
Comment: NEGATIVE
Comment: NORMAL
Neisseria Gonorrhea: NEGATIVE
Trichomonas: NEGATIVE

## 2021-10-25 ENCOUNTER — Ambulatory Visit: Payer: Medicaid Other

## 2022-07-27 ENCOUNTER — Other Ambulatory Visit: Payer: Self-pay | Admitting: Physician Assistant

## 2022-07-27 DIAGNOSIS — Z1231 Encounter for screening mammogram for malignant neoplasm of breast: Secondary | ICD-10-CM

## 2022-08-08 ENCOUNTER — Emergency Department (HOSPITAL_COMMUNITY)
Admission: EM | Admit: 2022-08-08 | Discharge: 2022-08-08 | Disposition: A | Discharge status: Home / Self Care | Payer: Medicaid Other | Attending: Emergency Medicine | Admitting: Emergency Medicine

## 2022-08-08 ENCOUNTER — Encounter (HOSPITAL_COMMUNITY): Payer: Self-pay

## 2022-08-08 ENCOUNTER — Emergency Department (HOSPITAL_COMMUNITY): Payer: Medicaid Other

## 2022-08-08 ENCOUNTER — Other Ambulatory Visit: Payer: Self-pay

## 2022-08-08 DIAGNOSIS — Y92410 Unspecified street and highway as the place of occurrence of the external cause: Secondary | ICD-10-CM | POA: Insufficient documentation

## 2022-08-08 DIAGNOSIS — M545 Low back pain, unspecified: Secondary | ICD-10-CM | POA: Insufficient documentation

## 2022-08-08 DIAGNOSIS — S161XXA Strain of muscle, fascia and tendon at neck level, initial encounter: Secondary | ICD-10-CM | POA: Diagnosis not present

## 2022-08-08 DIAGNOSIS — S199XXA Unspecified injury of neck, initial encounter: Secondary | ICD-10-CM | POA: Diagnosis present

## 2022-08-08 DIAGNOSIS — R519 Headache, unspecified: Secondary | ICD-10-CM | POA: Diagnosis not present

## 2022-08-08 LAB — URINALYSIS, ROUTINE W REFLEX MICROSCOPIC
Bilirubin Urine: NEGATIVE
Glucose, UA: NEGATIVE mg/dL
Hgb urine dipstick: NEGATIVE
Ketones, ur: NEGATIVE mg/dL
Nitrite: NEGATIVE
Protein, ur: 30 mg/dL — AB
Specific Gravity, Urine: 1.029 (ref 1.005–1.030)
WBC, UA: >50 WBC/hpf — ABNORMAL HIGH (ref 0–5)
pH: 5 (ref 5.0–8.0)

## 2022-08-08 LAB — PREGNANCY, URINE: Preg Test, Ur: NEGATIVE

## 2022-08-08 MED ORDER — NAPROXEN 375 MG PO TABS: 375.0000 mg | ORAL_TABLET | Freq: Two times a day (BID) | ORAL | 0 refills | Status: DC

## 2022-08-08 MED ORDER — HYDROCODONE-ACETAMINOPHEN 5-325 MG PO TABS
1.0000 | ORAL_TABLET | Freq: Once | ORAL | Status: AC
  Administered 2022-08-08: 1 via ORAL
  Filled 2022-08-08: qty 1

## 2022-08-08 MED ORDER — CEPHALEXIN 500 MG PO CAPS: 500.0000 mg | ORAL_CAPSULE | Freq: Three times a day (TID) | ORAL | 0 refills | Status: AC

## 2022-08-08 MED ORDER — METHOCARBAMOL 500 MG PO TABS: 500.0000 mg | ORAL_TABLET | Freq: Two times a day (BID) | ORAL | 0 refills | Status: DC

## 2022-08-08 MED ORDER — LIDOCAINE 5 % EX PTCH: 1.0000 | MEDICATED_PATCH | CUTANEOUS | 0 refills | Status: DC
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
## 2022-08-08 NOTE — ED Notes (Signed)
I provided reinforced discharge education based off of after visit summary/care provided. Pt acknowledged and understood my education. Pt had no further questions/concerns for provider/myself. After visit summary provided to pt. 
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
## 2022-08-10 LAB — URINE CULTURE

## 2022-08-11 ENCOUNTER — Telehealth: Payer: Self-pay | Admitting: *Deleted

## 2022-08-11 NOTE — Telephone Encounter (Signed)
Post ED Visit - Positive Culture Follow-up  Culture report reviewed by antimicrobial stewardship pharmacist: [facility] []  , Pharm.D. []  Enzo Bi, Pharm.D., BCPS AQ-ID []  , Pharm.D., BCPS []  Celedonio Miyamoto, Pharm.D., BCPS []  Mason, Garvin Fila.D., BCPS, AAHIVP []  , Pharm.D., BCPS, AAHIVP []  Georgina Pillion, PharmD, BCPS []  , PharmD, BCPS []  [place], PharmD, BCPS []  [address], PharmD []  , PharmD, BCPS []  Estella Husk, PharmD  Pharmacy Team []  Lysle Pearl, PharmD []  , PharmD []  Phillips Climes, PharmD []  , Rph []  Agapito Games) , PharmD []  Verlan Friends, PharmD []  , PharmD []  Mervyn Gay, PharmD []  , PharmD []  Vinnie Level, PharmD []  [facility], PharmD []  , PharmD []  Len Childs, PharmD   Positive urine culture Treated with Cephalexin, organism sensitive to the same and no further patient follow-up is required at this time. . PharmD  Greer Pickerel Talley 08/11/2022, 12:49 PM

## 2022-08-16 ENCOUNTER — Ambulatory Visit: Payer: Medicaid Other

## 2022-08-22 ENCOUNTER — Ambulatory Visit (HOSPITAL_COMMUNITY)
Admission: EM | Admit: 2022-08-22 | Discharge: 2022-08-22 | Disposition: A | Discharge status: Home / Self Care | Payer: Medicaid Other | Attending: Emergency Medicine | Admitting: Emergency Medicine

## 2022-08-22 DIAGNOSIS — L732 Hidradenitis suppurativa: Secondary | ICD-10-CM

## 2022-08-22 MED ORDER — DOXYCYCLINE HYCLATE 100 MG PO CAPS: 100.0000 mg | ORAL_CAPSULE | Freq: Two times a day (BID) | ORAL | 0 refills | Status: DC

## 2022-08-22 MED ORDER — CLINDAMYCIN PHOSPHATE 1 % EX GEL: CUTANEOUS | 2 refills | Status: DC

## 2022-08-22 MED ORDER — DOXYCYCLINE HYCLATE 100 MG PO CAPS: 100.0000 mg | ORAL_CAPSULE | Freq: Two times a day (BID) | ORAL | 0 refills | Status: AC

## 2022-08-22 MED ORDER — NAPROXEN 500 MG PO TABS: 500.0000 mg | ORAL_TABLET | Freq: Two times a day (BID) | ORAL | 0 refills | Status: AC

## 2022-08-22 MED ORDER — NAPROXEN 500 MG PO TABS: 500.0000 mg | ORAL_TABLET | Freq: Two times a day (BID) | ORAL | 0 refills | Status: DC

## 2022-08-22 NOTE — Discharge Instructions (Signed)
I provided you with 2 antibiotics, 1 to take by mouth and one to use topically.  The oral antibiotic is doxycycline and should be taken twice daily for 90 days.  The topical antibiotic is clindamycin gel and should be applied to the affected areas either under your arms or in your groin area twice daily until lesions resolved.  For pain, I provided you with a prescription for naproxen 500 mg that you can take twice daily as needed for pain.  Please reach out to Ms. Scheffield with dermatology for further follow-up of this issue.

## 2022-08-22 NOTE — ED Provider Notes (Signed)
[facility]    CSN: [id number] Arrival date & time: 08/22/22  1827    HISTORY   Chief Complaint  Patient presents with   Abscess   HPI Vicki Vincent is a pleasant, 46 y.o. female who presents to urgent care today. Pt reports an abscess on her labia since yesterday. States it has gotten bigger since yesterday, denies any drainage. States has been prescribed Keflex for previous, similar lesions.  Patient states she used to get them under her right arm but those have resolved.  Patient states she has been diagnosed with hidradenitis suppurativa.  Patient politely declines offer for I&D today.  Patient states she is requesting antibiotics and pain medication at this time.  Patient reports a history of gestational diabetes, does not recall the last time she had an A1c checked.  Patient states she has noticed that when she drinks soda lesions similar to this 1 appear.  The history is provided by the patient.   No past medical history on file. Patient Active Problem List   Diagnosis Date Noted   BMI 38.0-38.9,adult 08/15/2021   Current every day smoker 08/15/2021   No past surgical history on file. OB History   No obstetric history on file.    Home Medications    Prior to Admission medications   Medication Sig Start Date End Date Taking? Authorizing Provider  clindamycin (CLINDAGEL) 1 % gel Apply to both underarms twice daily. 08/22/22   Theadora Rama Scales, PA-C  doxycycline (VIBRAMYCIN) 100 MG capsule Take 1 capsule (100 mg total) by mouth 2 (two) times daily. 08/22/22 11/20/22  Theadora Rama Scales, PA-C  lidocaine (LIDODERM) 5 % Place 1 patch onto the skin daily. Remove & Discard patch within 12 hours or as directed by MD 08/08/22   Marita Kansas, PA-C  methocarbamol (ROBAXIN) 500 MG tablet Take 1 tablet (500 mg total) by mouth 2 (two) times daily. 08/08/22   Karie Mainland, Amjad, PA-C  naproxen (NAPROSYN) 500 MG tablet Take 1 tablet (500 mg total) by mouth 2 (two) times daily.  08/22/22 09/21/22  Theadora Rama Scales, PA-C  norethindrone (MICRONOR) 0.35 MG tablet Take 1 tablet (0.35 mg total) by mouth daily. 08/15/21   Currie Paris, NP  norgestimate-ethinyl estradiol (ORTHO-CYCLEN) 0.25-35 MG-MCG tablet Take 1 tablet by mouth daily.  03/01/20  [provider]    Family History No family history on file. Social History Social History   Tobacco Use   Smoking status: Every Day    Packs/day: 0.20    Types: Cigarettes   Smokeless tobacco: Never  Substance Use Topics   Alcohol use: Yes   Drug use: No   Allergies   Patient has no known allergies.  Review of Systems Review of Systems Pertinent findings revealed after performing a 14 point review of systems has been noted in the history of present illness.  Physical Exam Triage Vital Signs ED Triage Vitals  Enc Vitals Group     BP 10/21/21 0827 (!) 147/82     Pulse Rate 10/21/21 0827 72     Resp 10/21/21 0827 18     Temp 10/21/21 0827 98.3 F (36.8 C)     Temp Source 10/21/21 0827 Oral     SpO2 10/21/21 0827 98 %     Weight --      Height --      Head Circumference --      Peak Flow --      Pain Score 10/21/21 0826 5  Pain Loc --      Pain Edu? --      Excl. in GC? --   No data found.  Updated Vital Signs BP 115/82 (BP Location: Right Arm)   Pulse (!) 101   Temp 98.3 F (36.8 C) (Oral)   Resp 17   SpO2 97%   Physical Exam Vitals and nursing note reviewed.  Constitutional:      General: She is not in acute distress.    Appearance: Normal appearance.  HENT:     Head: Normocephalic and atraumatic.  Eyes:     Pupils: Pupils are equal, round, and reactive to light.  Cardiovascular:     Rate and Rhythm: Normal rate and regular rhythm.  Pulmonary:     Effort: Pulmonary effort is normal.     Breath sounds: Normal breath sounds.  Genitourinary:    General: Normal vulva.     Exam position: Supine.     Pubic Area: No rash or pubic lice.      Tanner stage (genital): 5.      Labia:        Left: Tenderness and lesion (Nonfluctuant, firm, erythematous, tender to palpation) present. No rash or injury.     Musculoskeletal:        General: Normal range of motion.     Cervical back: Normal range of motion and neck supple.  Skin:    General: Skin is warm and dry.  Neurological:     General: No focal deficit present.     Mental Status: She is alert and oriented to person, place, and time. Mental status is at baseline.  Psychiatric:        Mood and Affect: Mood normal.        Behavior: Behavior normal.        Thought Content: Thought content normal.        Judgment: Judgment normal.     Visual Acuity Right Eye Distance:   Left Eye Distance:   Bilateral Distance:    Right Eye Near:   Left Eye Near:    Bilateral Near:     UC Couse / Diagnostics / Procedures:     Radiology No results found.  Procedures Procedures (including critical care time) EKG  Pending results:  Labs Reviewed - No data to display  Medications Ordered in UC: Medications - No data to display  UC Diagnoses / Final Clinical Impressions(s)   I have reviewed the triage vital signs and the nursing notes.  Pertinent labs & imaging results that were available during my care of the patient were reviewed by me and considered in my medical decision making (see chart for details).    Final diagnoses:  Hidradenitis suppurativa   Prescriptions reordered secondary to patient requesting printed versions.  Patient advised to begin topical Clindagel and doxycycline.  Patient also provided with naproxen 500 mg twice daily for pain.  Patient provided with the name of a dermatologist to reach out to him for further treatment of recurrent lesions.  ED Prescriptions     Medication Sig Dispense Auth. Provider   naproxen (NAPROSYN) 500 MG tablet  (Status: Discontinued) Take 1 tablet (500 mg total) by mouth 2 (two) times daily. 60 tablet Theadora Rama Scales, PA-C   clindamycin (CLINDAGEL) 1 %  gel  (Status: Discontinued) Apply to both underarms twice daily. 60 g Theadora Rama Scales, PA-C   doxycycline (VIBRAMYCIN) 100 MG capsule  (Status: Discontinued) Take 1 capsule (100 mg total) by mouth 2 (two)  times daily. 180 capsule Theadora Rama Scales, PA-C   clindamycin (CLINDAGEL) 1 % gel Apply to both underarms twice daily. 60 g Theadora Rama Scales, PA-C   doxycycline (VIBRAMYCIN) 100 MG capsule Take 1 capsule (100 mg total) by mouth 2 (two) times daily. 180 capsule Theadora Rama Scales, PA-C   naproxen (NAPROSYN) 500 MG tablet Take 1 tablet (500 mg total) by mouth 2 (two) times daily. 60 tablet Theadora Rama Scales, PA-C      PDMP not reviewed this encounter.  Pending results:  Labs Reviewed - No data to display  Discharge Instructions:   Discharge Instructions      I provided you with 2 antibiotics, 1 to take by mouth and one to use topically.  The oral antibiotic is doxycycline and should be taken twice daily for 90 days.  The topical antibiotic is clindamycin gel and should be applied to the affected areas either under your arms or in your groin area twice daily until lesions resolved.  For pain, I provided you with a prescription for naproxen 500 mg that you can take twice daily as needed for pain.  Please reach out to Ms. Scheffield with dermatology for further follow-up of this issue.    Disposition Upon Discharge:  Condition: stable for discharge home  Patient presented with an acute illness with associated systemic symptoms and significant discomfort requiring urgent management. In my opinion, this is a condition that a prudent lay person (someone who possesses an average knowledge of health and medicine) may potentially expect to result in complications if not addressed urgently such as respiratory distress, impairment of bodily function or dysfunction of bodily organs.   Routine symptom specific, illness specific and/or disease specific instructions were  discussed with the patient and/or caregiver at length.   As such, the patient has been evaluated and assessed, work-up was performed and treatment was provided in alignment with urgent care protocols and evidence based medicine.  Patient/parent/caregiver has been advised that the patient may require follow up for further testing and treatment if the symptoms continue in spite of treatment, as clinically indicated and appropriate.  Patient/parent/caregiver has been advised to return to the [facility] or PCP if no better; to PCP or the Emergency Department if new signs and symptoms develop, or if the current signs or symptoms continue to change or worsen for further workup, evaluation and treatment as clinically indicated and appropriate  The patient will follow up with their current PCP if and as advised. If the patient does not currently have a PCP we will assist them in obtaining one.   The patient may need specialty follow up if the symptoms continue, in spite of conservative treatment and management, for further workup, evaluation, consultation and treatment as clinically indicated and appropriate.   Patient/parent/caregiver verbalized understanding and agreement of plan as discussed.  All questions were addressed during visit.  Please see discharge instructions below for further details of plan.  This office note has been dictated using Teaching laboratory technician.  Unfortunately, this method of dictation can sometimes lead to typographical or grammatical errors.  I apologize for your inconvenience in advance if this occurs.  Please do not hesitate to reach out to me if clarification is needed.      Theadora Rama Scales, PA-C 08/22/22 2039

## 2022-08-22 NOTE — ED Triage Notes (Signed)
Pt reports an abscess on her labia since yesterday. States it has gotten bigger since yesterday, denies any drainage. States had previous prescription for keflex.

## 2023-07-03 ENCOUNTER — Encounter (HOSPITAL_COMMUNITY): Payer: Self-pay | Admitting: Emergency Medicine

## 2023-07-03 ENCOUNTER — Ambulatory Visit (HOSPITAL_COMMUNITY)
Admission: EM | Admit: 2023-07-03 | Discharge: 2023-07-03 | Disposition: A | Discharge status: Home / Self Care | Payer: 59 | Attending: Family Medicine | Admitting: Family Medicine

## 2023-07-03 DIAGNOSIS — R220 Localized swelling, mass and lump, head: Secondary | ICD-10-CM | POA: Diagnosis not present

## 2023-07-03 MED ORDER — TOBRAMYCIN 0.3 % OP SOLN: 1.0000 [drp] | Freq: Four times a day (QID) | OPHTHALMIC | 0 refills | Status: DC

## 2023-07-03 MED ORDER — PREDNISONE 20 MG PO TABS: 40.0000 mg | ORAL_TABLET | Freq: Every day | ORAL | 0 refills | Status: DC

## 2023-07-03 NOTE — ED Provider Notes (Signed)
  [facility]   [id number] 07/03/23 Arrival Time: 1342  ASSESSMENT & PLAN:  1. Facial swelling    Suspect allergic. She is concerned about possibility of developing conjunctivitis; has been rubbing eye with discharge noted this am.  Begin: Meds ordered this encounter  Medications   predniSONE (DELTASONE) 20 MG tablet    Sig: Take 2 tablets (40 mg total) by mouth daily.    Dispense:  10 tablet    Refill:  0   tobramycin (TOBREX) 0.3 % ophthalmic solution    Sig: Place 1 drop into the right eye every 6 (six) hours.    Dispense:  5 mL    Refill:  0    Follow-up Information     [facility].   Specialty: Urgent Care Why: If worsening or failing to improve as anticipated. Contact information: [address] [place] [address] [phone]                 Reviewed expectations re: course of current medical issues. Questions answered. Outlined signs and symptoms indicating need for more acute intervention. Patient verbalized understanding. After Visit Summary given.   SUBJECTIVE:  Vicki Vincent is a 47 y.o. female who reports swelling of face; around L eye; few days after outside cookout; is itching. Noted drainage from L eye this am. Denies fever/ST/neck pain. No visual changes. No tx PTA.   OBJECTIVE:  Vitals:   07/03/23 1452  BP: 124/81  Pulse: 86  Resp: 17  Temp: 98.1 F (36.7 C)  TempSrc: Oral  SpO2: 98%    General appearance: alert; no distress HEENT: with swelling of L periorbital swelling; no overlying erythema; skin is irritated and dry; PERRLA; EOMI without pain; very slight L conjunctival irritation Neck: supple with FROM; no lymphadenopathy Psychological: alert and cooperative; normal mood and affect  No Known Allergies  History reviewed. No pertinent past medical history. Social History   Socioeconomic History   Marital status: Single    Spouse name: Not on file   Number of  children: Not on file   Years of education: Not on file   Highest education level: Not on file  Occupational History   Not on file  Tobacco Use   Smoking status: Every Day    Packs/day: .2    Types: Cigarettes   Smokeless tobacco: Never  Substance and Sexual Activity   Alcohol use: Yes   Drug use: No   Sexual activity: Not on file  Other Topics Concern   Not on file  Social History Narrative   Not on file   Social Determinants of Health   Financial Resource Strain: Not on file  Food Insecurity: No Food Insecurity (08/22/2021)   Hunger Vital Sign    Worried About Running Out of Food in the Last Year: Never true    Ran Out of Food in the Last Year: Never true  Transportation Needs: No Transportation Needs (08/22/2021)   PRAPARE - Administrator, Civil Service (Medical): No    Lack of Transportation (Non-Medical): No  Physical Activity: Not on file  Stress: Not on file  Social Connections: Not on file  Intimate Partner Violence: Not on file   No family history on file.         Mardella Layman, MD 07/03/23 1620

## 2023-07-03 NOTE — ED Triage Notes (Signed)
Pt reports left eye swelling, face dryness. Went to a cook out on Sunday. People told her she got sun burnt but didn't think she could.

## 2023-11-25 ENCOUNTER — Other Ambulatory Visit: Payer: Self-pay

## 2023-11-25 ENCOUNTER — Emergency Department (HOSPITAL_BASED_OUTPATIENT_CLINIC_OR_DEPARTMENT_OTHER)
Admission: EM | Admit: 2023-11-25 | Discharge: 2023-11-25 | Disposition: A | Discharge status: Home / Self Care | Payer: 59

## 2023-11-25 DIAGNOSIS — L02412 Cutaneous abscess of left axilla: Secondary | ICD-10-CM | POA: Insufficient documentation

## 2023-11-25 DIAGNOSIS — L0291 Cutaneous abscess, unspecified: Secondary | ICD-10-CM

## 2023-11-25 LAB — PREGNANCY, URINE: Preg Test, Ur: NEGATIVE

## 2023-11-25 MED ORDER — DOXYCYCLINE HYCLATE 100 MG PO CAPS: 100.0000 mg | ORAL_CAPSULE | Freq: Two times a day (BID) | ORAL | 0 refills | Status: DC

## 2023-11-25 MED ORDER — HYDROMORPHONE HCL 1 MG/ML IJ SOLN
1.0000 mg | Freq: Once | INTRAMUSCULAR | Status: AC
  Administered 2023-11-25: 1 mg via INTRAMUSCULAR
  Filled 2023-11-25: qty 1

## 2023-11-25 MED ORDER — OXYCODONE-ACETAMINOPHEN 5-325 MG PO TABS: 1.0000 | ORAL_TABLET | Freq: Four times a day (QID) | ORAL | 0 refills | Status: DC | PRN

## 2023-11-25 NOTE — ED Triage Notes (Signed)
Pt POV from home reporting cyst L axilla. Hx HS. Painful to touch.

## 2023-11-25 NOTE — ED Provider Notes (Signed)
[facility] Provider Note   CSN: [id number] Arrival date & time: 11/25/23  2054     History Chief Complaint  Patient presents with   Cyst   Vicki Vincent is a 47 y.o. female with h/o HS presents to the ER for evaluation of left axilla abscess.  She has had surgery to that axilla years ago in the [place] and has not had issues.  She reports that she has been having pain in the area for the past 2 weeks but is worsened over the past week.  No discharge from the area.  No fevers.  She has not tried any medication other than warm compresses for relief.  She denies any chest pain or shortness of breath.  Denies any numbness or tingling into the arm.  She denies any fevers.  HPI     Home Medications Prior to Admission medications   Medication Sig Start Date End Date Taking? Authorizing Provider  doxycycline (VIBRAMYCIN) 100 MG capsule Take 1 capsule (100 mg total) by mouth 2 (two) times daily. 11/25/23  Yes Achille Rich, PA-C  oxyCODONE-acetaminophen (PERCOCET/ROXICET) 5-325 MG tablet Take 1 tablet by mouth every 6 (six) hours as needed for severe pain (pain score 7-10). 11/25/23  Yes Achille Rich, PA-C  predniSONE (DELTASONE) 20 MG tablet Take 2 tablets (40 mg total) by mouth daily. 07/03/23   Mardella Layman, MD  tobramycin (TOBREX) 0.3 % ophthalmic solution Place 1 drop into the right eye every 6 (six) hours. 07/03/23   Mardella Layman, MD  norgestimate-ethinyl estradiol (ORTHO-CYCLEN) 0.25-35 MG-MCG tablet Take 1 tablet by mouth daily.  03/01/20  [provider]      Allergies    Patient has no known allergies.    Review of Systems   Review of Systems  Constitutional:  Negative for chills and fever.  Respiratory:  Negative for shortness of breath.   Cardiovascular:  Negative for chest pain.  Skin:        Reports abscess  Neurological:  Negative for numbness.    Physical Exam Updated Vital Signs BP 115/75 (BP Location: Right Arm)    Pulse 97   Temp 98 F (36.7 C)   Resp 16   Ht 5\' 9"  (1.753 m)   Wt 104.3 kg   SpO2 100%   BMI 33.97 kg/m  Physical Exam Vitals and nursing note reviewed.  Constitutional:      Appearance: She is not toxic-appearing.     Comments: Uncomfortable, no acute distress  Eyes:     General: No scleral icterus. Cardiovascular:     Rate and Rhythm: Normal rate.     Pulses:          Radial pulses are 2+ on the right side and 2+ on the left side.  Pulmonary:     Effort: Pulmonary effort is normal. No respiratory distress.  Skin:    General: Skin is warm and dry.     Comments: Faint surgical scarring present in the axilla.  There is approximately 5 cm area of linear induration.  No palpable fluctuance.  Extremely tender to palpation.  No overlying erythema or warmth.  No central pustule.  No area of drainage.  Compartments are soft.  Palpable and symmetric radial pulses bilaterally.  No red streaking noted.  Neurological:     Mental Status: She is alert.     ED Results / Procedures / Treatments   Labs (all labs ordered are listed, but only abnormal results are displayed)  Labs Reviewed  PREGNANCY, URINE    EKG None  Radiology No results found.  Procedures Procedures   Medications Ordered in ED Medications  HYDROmorphone (DILAUDID) injection 1 mg (1 mg Intramuscular Given 11/25/23 2159)    ED Course/ Medical Decision Making/ A&P    Medical Decision Making Amount and/or Complexity of Data Reviewed Labs: ordered.  Risk Prescription drug management.   47 y.o. female presents to the ER for evaluation of axilla cyst. Differential diagnosis includes but is not limited to abscess, cyst, cellulitis. Vital signs  mild tahcyardia, likely 2/2 pain. Physical exam as noted above.   I independently reviewed and interpreted the patient's labs. Pregnancy test is negative.  Patient was given IM Dilaudid for pain.  The patient has some firmness to the area.  No distinct  fluctuation.  Will ultrasound to see if can be drained.  Patient does not want the area drained.  She reports that she just wants to follow-up with general surgery and take medications.  I discussed with her that she will likely have some relief of pain with the drainage however she does not want this.  I will provide her the information for [place] surgery.  I also provided her with an antibiotic and small amount of narcotics for pain relief.  Discussed with her that she needs to return to the ER experiencing new or worsening symptoms.  She verbalized understanding.  We discussed the results of the labs/imaging. The plan is return to the ER if worsens, take medications as prescribed, follow-up with general surgery.. We discussed strict return precautions and red flag symptoms. The patient verbalized their understanding and agrees to the plan. The patient is stable and being discharged home in good condition.  Portions of this report may have been transcribed using voice recognition software. Every effort was made to ensure accuracy; however, inadvertent computerized transcription errors may be present.   Final Clinical Impression(s) / ED Diagnoses Final diagnoses:  Abscess    Rx / DC Orders ED Discharge Orders          Ordered    oxyCODONE-acetaminophen (PERCOCET/ROXICET) 5-325 MG tablet  Every 6 hours PRN        11/25/23 2353    doxycycline (VIBRAMYCIN) 100 MG capsule  2 times daily        11/25/23 2353              Achille Rich, PA-C 11/26/23 0045    Coral Spikes, DO 11/29/23 1502

## 2023-11-25 NOTE — Discharge Instructions (Addendum)
You were seen in the ER for evaluation of your abscess.  You have asked for it not to be drained.  I think this would provide you relief if you did have it drained.  I am recommending you take 600 mg of ibuprofen and or 1000 mg of Tylenol every 6 hours as needed for pain.  I have prescribed you a few narcotic pain medications to take for breakthrough pain.  Additionally, it is important that you take the antibiotic as prescribed.  Please make sure you complete the entirety of the course.  I have included information for general surgeon into the discharge report.  Please make sure you call to schedule an appointment.  I also commend applying warm compresses to the area.  If you have any concerns, new or worsening symptoms, please return to your nearest emergency department for evaluation.  I have included the information for finding a PCP in your area as well!  Contact a doctor if: You see red streaks on your skin near the abscess. You have any signs of worse infection. You vomit every time you eat or drink. You have a fever, chills, or muscle aches. The cyst or abscess comes back. Get help right away if: You have very bad pain. You make less pee (urine) than normal.

## 2024-06-10 ENCOUNTER — Encounter: Payer: 59 | Admitting: Internal Medicine

## 2024-06-10 NOTE — Progress Notes (Deleted)
   Office Visit Note  Patient: Vicki Vincent             Date of Birth: 1976/09/23           MRN: [id number]             PCP: Pcp, No Referring: [place]ares Commons, Arlester Ladd* Visit Date: 06/10/2024 Occupation: @GUAROCC @  Subjective:  No chief complaint on file.   History of Present Illness: Vicki Vincent is a 48 y.o. female ***     Activities of Daily Living:  Patient reports morning stiffness for *** {minute/hour:19697}.   Patient {ACTIONS;DENIES/REPORTS:21021675::Denies} nocturnal pain.  Difficulty dressing/grooming: {ACTIONS;DENIES/REPORTS:21021675::Denies} Difficulty climbing stairs: {ACTIONS;DENIES/REPORTS:21021675::Denies} Difficulty getting out of chair: {ACTIONS;DENIES/REPORTS:21021675::Denies} Difficulty using hands for taps, buttons, cutlery, and/or writing: {ACTIONS;DENIES/REPORTS:21021675::Denies}  No Rheumatology ROS completed.   PMFS History:  Patient Active Problem List   Diagnosis Date Noted   BMI 38.0-38.9,adult 08/15/2021   Current every day smoker 08/15/2021    No past medical history on file.  No family history on file. No past surgical history on file. Social History   Social History Narrative   Not on file    There is no immunization history on file for this patient.   Objective: Vital Signs: There were no vitals taken for this visit.   Physical Exam   Musculoskeletal Exam: ***  CDAI Exam: CDAI Score: -- Patient Global: --; Provider Global: -- Swollen: --; Tender: -- Joint Exam 06/10/2024   No joint exam has been documented for this visit   There is currently no information documented on the homunculus. Go to the Rheumatology activity and complete the homunculus joint exam.  Investigation: No additional findings.  Imaging: No results found.  Recent Labs: Lab Results  Component Value Date   WBC 5.7 07/23/2019   HGB 11.2 (L) 07/23/2019   PLT 364 07/23/2019   NA 139 07/23/2019   K 3.8 07/23/2019   CL 107 07/23/2019    CO2 22 07/23/2019   GLUCOSE 96 07/23/2019   BUN 8 07/23/2019   CREATININE 0.54 07/23/2019   BILITOT 0.1 (L) 07/23/2019   ALKPHOS 58 07/23/2019   AST 24 07/23/2019   ALT 26 07/23/2019   PROT 6.9 07/23/2019   ALBUMIN 3.0 (L) 07/23/2019   CALCIUM 9.3 07/23/2019   GFRAA >60 07/23/2019    Speciality Comments: No specialty comments available.  Procedures:  No procedures performed Allergies: Patient has no known allergies.   Assessment / Plan:     Visit Diagnoses: No diagnosis found.  Orders: No orders of the defined types were placed in this encounter.  No orders of the defined types were placed in this encounter.   Face-to-face time spent with patient was *** minutes. Greater than 50% of time was spent in counseling and coordination of care.  Follow-Up Instructions: No follow-ups on file.   Matt Song, MD  Note - This record has been created using [employer].  Chart creation errors have been sought, but may not always  have been located. Such creation errors do not reflect on  the standard of medical care.

## 2024-07-03 ENCOUNTER — Ambulatory Visit (HOSPITAL_COMMUNITY)
Admission: EM | Admit: 2024-07-03 | Discharge: 2024-07-03 | Disposition: A | Discharge status: Home / Self Care | Payer: Self-pay | Attending: Nurse Practitioner | Admitting: Nurse Practitioner

## 2024-07-03 ENCOUNTER — Encounter (HOSPITAL_COMMUNITY): Payer: Self-pay

## 2024-07-03 DIAGNOSIS — L02416 Cutaneous abscess of left lower limb: Secondary | ICD-10-CM | POA: Insufficient documentation

## 2024-07-03 MED ORDER — LIDOCAINE HCL (PF) 2 % IJ SOLN
INTRAMUSCULAR | Status: AC
  Filled 2024-07-03: qty 5

## 2024-07-03 MED ORDER — SULFAMETHOXAZOLE-TRIMETHOPRIM 800-160 MG PO TABS: 1.0000 | ORAL_TABLET | Freq: Two times a day (BID) | ORAL | 0 refills | Status: AC

## 2024-07-03 NOTE — ED Triage Notes (Signed)
 Patient presenting with possible cyst on the left thigh onset yesterday while at work. Patient states it looks as though it is ready to pop and states the area is painful.   Prescriptions or OTC medications tried: Yes- hot compress   with no relief

## 2024-07-03 NOTE — Discharge Instructions (Addendum)
 You were seen today for an abscess.  An abscess is an infection under the skin that can happen anywhere on the body. If not treated, it can get worse and cause a more serious infection.  Today, we performed a procedure called an I&D, which stands for incision and drainage. This means a small cut was made to let the infection and pus drain out. Keep the dressing in place for the next 24 hours. You may then remove the dressing. Keep the site covered with a Band-Aid. Keep using warm, moist compresses on the area to help with healing.   A sample of the drainage was sent to the lab to check for any bacteria. You will only be contacted about these results if abnormal and requires change in treatment; otherwise, you may review the results on your MyChart.   You have been prescribed antibiotics. Be sure to take them exactly as directed, and always take them with food to help avoid an upset stomach.   Watch the area closely for any signs that the infection is getting worse, such as increased redness, swelling, pain, pus, or fever. If you notice any of these symptoms, return to urgent care or go to the ED right away.

## 2024-07-03 NOTE — ED Provider Notes (Signed)
 [facility]    CSN: [id number] Arrival date & time: 07/03/24  0950      History   Chief Complaint Chief Complaint  Patient presents with   Mass    HPI Vicki Vincent is a 48 y.o. female.   Discussed the use of AI scribe software for clinical note transcription with the patient, who gave verbal consent to proceed.   The patient presents with a painful cyst on her inner left thigh that developed recently. The cyst appeared a couple of days ago and has become increasingly painful, affecting the patient's ability to drive and walk. The patient describes the pain as severe, stating it hurts so bad. She attempted to apply a hot compress to the area but was unable to drain it herself due to the pain. The cyst appears to have come to a head. The onset was rapid, with the patient noticing it a couple of days ago and experiencing significant pain by yesterday. The patient has not been running any fevers and denies being diabetic.  The following portions of the patient's history were reviewed and updated as appropriate: allergies, current medications, past family history, past medical history, past social history, past surgical history, and problem list.    History reviewed. No pertinent past medical history.  Patient Active Problem List   Diagnosis Date Noted   BMI 38.0-38.9,adult 08/15/2021   Current every day smoker 08/15/2021    History reviewed. No pertinent surgical history.  OB History   No obstetric history on file.      Home Medications    Prior to Admission medications   Medication Sig Start Date End Date Taking? Authorizing Provider  sulfamethoxazole -trimethoprim  (BACTRIM  DS) 800-160 MG tablet Take 1 tablet by mouth 2 (two) times daily for 7 days. 07/03/24 07/10/24 Yes Iola Lukes, FNP  norgestimate-ethinyl estradiol (ORTHO-CYCLEN) 0.25-35 MG-MCG tablet Take 1 tablet by mouth daily.  03/01/20  [provider]    Family History History  reviewed. No pertinent family history.  Social History Social History   Tobacco Use   Smoking status: Some Days    Current packs/day: 0.20    Types: Cigarettes   Smokeless tobacco: Never  Vaping Use   Vaping status: Never Used  Substance Use Topics   Alcohol use: Yes   Drug use: No     Allergies   Patient has no known allergies.   Review of Systems Review of Systems  Constitutional:  Negative for fever.  Skin:  Positive for wound.  All other systems reviewed and are negative.    Physical Exam Triage Vital Signs ED Triage Vitals  Encounter Vitals Group     BP 07/03/24 1015 121/80     Girls Systolic BP Percentile --      Girls Diastolic BP Percentile --      Boys Systolic BP Percentile --      Boys Diastolic BP Percentile --      Pulse Rate 07/03/24 1015 (!) 113     Resp 07/03/24 1015 18     Temp 07/03/24 1015 98.3 F (36.8 C)     Temp Source 07/03/24 1015 Oral     SpO2 07/03/24 1015 98 %     Weight 07/03/24 1014 270 lb (122.5 kg)     Height 07/03/24 1014 5' 9 (1.753 m)     Head Circumference --      Peak Flow --      Pain Score 07/03/24 1013 10     Pain Loc --  Pain Education --      Exclude from Growth Chart --    No data found.  Updated Vital Signs BP 121/80 (BP Location: Left Arm)   Pulse (!) 113   Temp 98.3 F (36.8 C) (Oral)   Resp 18   Ht 5' 9 (1.753 m)   Wt 270 lb (122.5 kg)   LMP 06/24/2024 (Approximate)   SpO2 98%   BMI 39.87 kg/m   Visual Acuity Right Eye Distance:   Left Eye Distance:   Bilateral Distance:    Right Eye Near:   Left Eye Near:    Bilateral Near:     Physical Exam Vitals reviewed.  Constitutional:      General: She is awake. She is not in acute distress.    Appearance: Normal appearance. She is well-developed. She is not ill-appearing, toxic-appearing or diaphoretic.  HENT:     Head: Normocephalic.     Right Ear: Hearing normal.     Left Ear: Hearing normal.     Nose: Nose normal.     Mouth/Throat:      Mouth: Mucous membranes are moist.  Eyes:     General: Vision grossly intact.     Conjunctiva/sclera: Conjunctivae normal.  Cardiovascular:     Rate and Rhythm: Normal rate and regular rhythm.     Heart sounds: Normal heart sounds.  Pulmonary:     Effort: Pulmonary effort is normal.     Breath sounds: Normal breath sounds and air entry.  Musculoskeletal:        General: Normal range of motion.     Cervical back: Full passive range of motion without pain, normal range of motion and neck supple.  Skin:    General: Skin is warm and dry.     Findings: Abscess present.     Comments: A tender abscess is noted on the inner left thigh, with a visible purulent head at the surface but no active drainage or surrounding erythema. There is underlying fluctuance beneath the head, while the surrounding tissue remains firm on palpation (see picture below)    Neurological:     General: No focal deficit present.     Mental Status: She is alert and oriented to person, place, and time.     Sensory: Sensation is intact.     Gait: Gait is intact.  Psychiatric:        Mood and Affect: Mood is anxious.        Speech: Speech normal.        Behavior: Behavior is cooperative.      UC Treatments / Results  Labs (all labs ordered are listed, but only abnormal results are displayed) Labs Reviewed  AEROBIC CULTURE W GRAM STAIN (SUPERFICIAL SPECIMEN)    EKG   Radiology No results found.  Procedures Incision and Drainage  Date/Time: 07/03/2024 11:09 AM  Performed by: Iola Lukes, FNP Authorized by: Iola Lukes, FNP   Consent:    Consent obtained:  Verbal   Consent given by:  Patient   Risks, benefits, and alternatives were discussed: yes     Risks discussed:  Incomplete drainage and pain Universal protocol:    Patient identity confirmed:  Verbally with patient and arm band Location:    Type:  Abscess   Location:  Lower extremity (inner left thigh) Pre-procedure details:     Skin preparation:  Chlorhexidine with alcohol and povidone-iodine Anesthesia:    Anesthesia method:  Local infiltration   Local anesthetic:  Lidocaine  1% w/o epi  Procedure type:    Complexity:  Simple Procedure details:    Incision types:  Single straight   Wound management:  Extensive cleaning   Drainage:  Purulent   Drainage amount:  Scant   Wound treatment:  Wound left open   Packing materials:  None Post-procedure details:    Procedure completion:  Tolerated well, no immediate complications  (including critical care time)  Medications Ordered in UC Medications - No data to display  Initial Impression / Assessment and Plan / UC Course  I have reviewed the triage vital signs and the nursing notes.  Pertinent labs & imaging results that were available during my care of the patient were reviewed by me and considered in my medical decision making (see chart for details).   Patient presents with a painful cyst on the inner left thigh that has progressed over several days and now has a visible purulent head. The patient reports no fever and has no history of diabetes. Examination reveals a tender abscess with underlying fluctuance but no surrounding erythema or active drainage. Incision and drainage were performed, and a wound culture was obtained. Bactrim  DS was prescribed twice daily for 7 days with plans to adjust treatment based on culture results. Patient was instructed to continue warm moist compresses and provided detailed wound care instructions.  Further follow-up with PCP is advised if symptoms worsen, drainage increases, fever develops, or signs of spreading infection occur. ED evaluation is recommended for rapidly progressing redness, severe pain, or systemic symptoms.  Today's evaluation has revealed no signs of a dangerous process. Discussed diagnosis with patient and/or guardian. Patient and/or guardian aware of their diagnosis, possible red flag symptoms to watch out for and  need for close follow up. Patient and/or guardian understands verbal and written discharge instructions. Patient and/or guardian comfortable with plan and disposition.  Patient and/or guardian has a clear mental status at this time, good insight into illness (after discussion and teaching) and has clear judgment to make decisions regarding their care  Documentation was completed with the aid of voice recognition software. Transcription may contain typographical errors.    Final Clinical Impressions(s) / UC Diagnoses   Final diagnoses:  Abscess of left thigh     Discharge Instructions      You were seen today for an abscess.  An abscess is an infection under the skin that can happen anywhere on the body. If not treated, it can get worse and cause a more serious infection.  Today, we performed a procedure called an I&D, which stands for incision and drainage. This means a small cut was made to let the infection and pus drain out. Keep the dressing in place for the next 24 hours. You may then remove the dressing. Keep the site covered with a Band-Aid. Keep using warm, moist compresses on the area to help with healing.   A sample of the drainage was sent to the lab to check for any bacteria. You will only be contacted about these results if abnormal and requires change in treatment; otherwise, you may review the results on your MyChart.   You have been prescribed antibiotics. Be sure to take them exactly as directed, and always take them with food to help avoid an upset stomach.   Watch the area closely for any signs that the infection is getting worse, such as increased redness, swelling, pain, pus, or fever. If you notice any of these symptoms, return to urgent care or go to the ED right  away.      ED Prescriptions     Medication Sig Dispense Auth. Provider   sulfamethoxazole -trimethoprim  (BACTRIM  DS) 800-160 MG tablet Take 1 tablet by mouth 2 (two) times daily for 7 days. 14 tablet  Iola Lukes, FNP      PDMP not reviewed this encounter.   Iola Lukes, [place] 07/03/24 1113

## 2024-07-06 LAB — AEROBIC CULTURE W GRAM STAIN (SUPERFICIAL SPECIMEN): Gram Stain: NONE SEEN

## 2024-07-07 ENCOUNTER — Ambulatory Visit (HOSPITAL_COMMUNITY): Payer: Self-pay

## 2024-07-07 MED ORDER — CLINDAMYCIN HCL 300 MG PO CAPS: 300.0000 mg | ORAL_CAPSULE | Freq: Three times a day (TID) | ORAL | 0 refills | Status: AC

## 2024-09-11 ENCOUNTER — Ambulatory Visit: Admitting: Family Medicine

## 2024-10-13 ENCOUNTER — Ambulatory Visit: Admitting: Family Medicine

## 2025-01-01 ENCOUNTER — Ambulatory Visit (HOSPITAL_COMMUNITY)
Admission: EM | Admit: 2025-01-01 | Discharge: 2025-01-01 | Disposition: A | Discharge status: Home / Self Care | Attending: Emergency Medicine | Admitting: Emergency Medicine

## 2025-01-01 ENCOUNTER — Ambulatory Visit (INDEPENDENT_AMBULATORY_CARE_PROVIDER_SITE_OTHER)

## 2025-01-01 ENCOUNTER — Encounter (HOSPITAL_COMMUNITY): Payer: Self-pay

## 2025-01-01 DIAGNOSIS — R051 Acute cough: Secondary | ICD-10-CM | POA: Diagnosis not present

## 2025-01-01 DIAGNOSIS — J40 Bronchitis, not specified as acute or chronic: Secondary | ICD-10-CM | POA: Diagnosis not present

## 2025-01-01 MED ORDER — PREDNISONE 20 MG PO TABS: 40.0000 mg | ORAL_TABLET | Freq: Every day | ORAL | 0 refills | Status: AC

## 2025-01-01 MED ORDER — PROMETHAZINE-DM 6.25-15 MG/5ML PO SYRP: 5.0000 mL | ORAL_SOLUTION | Freq: Four times a day (QID) | ORAL | 0 refills | Status: AC | PRN

## 2025-01-01 NOTE — ED Provider Notes (Signed)
 " [facility]    CSN: [id number] Arrival date & time: 01/01/25  9070      History   Chief Complaint Chief Complaint  Patient presents with   Cough    HPI Rachana Malesky is a 49 y.o. female.   Patient presents to clinic over concerns of cough x8 days  Cough started around or before New Years, started to cough real bad Taking OTC stuff w/o improvement, cough drops, mucinex, tussin raspberry  Voice has changed, spitting up yellow stuff Cough worse when laying down and at night  Does not think she has had any fevers, felt hot initially so she rubbed herself down with alcohol  Cut down smoking, maybe 1-2x daily if that when not sick, does not smoke much anymore       The history is provided by the patient and medical records.  Cough   History reviewed. No pertinent past medical history.  Patient Active Problem List   Diagnosis Date Noted   BMI 38.0-38.9,adult 08/15/2021   Current every day smoker 08/15/2021    History reviewed. No pertinent surgical history.  OB History   No obstetric history on file.      Home Medications    Prior to Admission medications  Medication Sig Start Date End Date Taking? Authorizing Provider  predniSONE  (DELTASONE ) 20 MG tablet Take 2 tablets (40 mg total) by mouth daily for 5 days. 01/01/25 01/06/25 Yes Keilana Morlock  N, FNP  promethazine -dextromethorphan (PROMETHAZINE -DM) 6.25-15 MG/5ML syrup Take 5 mLs by mouth 4 (four) times daily as needed for cough. 01/01/25  Yes Braileigh Landenberger  N, FNP  norgestimate-ethinyl estradiol (ORTHO-CYCLEN) 0.25-35 MG-MCG tablet Take 1 tablet by mouth daily.  03/01/20  [provider]    Family History History reviewed. No pertinent family history.  Social History Social History[1]   Allergies   Patient has no known allergies.   Review of Systems Review of Systems  Per HPI  Physical Exam Triage Vital Signs ED Triage Vitals  Encounter Vitals Group     BP 01/01/25 1058  110/76     Girls Systolic BP Percentile --      Girls Diastolic BP Percentile --      Boys Systolic BP Percentile --      Boys Diastolic BP Percentile --      Pulse Rate 01/01/25 1058 97     Resp 01/01/25 1058 18     Temp 01/01/25 1058 98 F (36.7 C)     Temp Source 01/01/25 1058 Oral     SpO2 01/01/25 1058 97 %     Weight --      Height --      Head Circumference --      Peak Flow --      Pain Score 01/01/25 1056 8     Pain Loc --      Pain Education --      Exclude from Growth Chart --    No data found.  Updated Vital Signs BP 110/76 (BP Location: Left Arm)   Pulse 97   Temp 98 F (36.7 C) (Oral)   Resp 18   LMP 12/25/2024 (Exact Date)   SpO2 97%   Visual Acuity Right Eye Distance:   Left Eye Distance:   Bilateral Distance:    Right Eye Near:   Left Eye Near:    Bilateral Near:     Physical Exam Constitutional:      General: She is not in acute distress.  Appearance: Normal appearance. She is well-developed. She is ill-appearing.  HENT:     Head: Normocephalic and atraumatic.     Right Ear: External ear normal.     Left Ear: External ear normal.     Nose: Nose normal.     Mouth/Throat:     Mouth: Mucous membranes are moist.  Eyes:     Conjunctiva/sclera: Conjunctivae normal.  Cardiovascular:     Rate and Rhythm: Normal rate and regular rhythm.     Heart sounds: Normal heart sounds. No murmur heard. Pulmonary:     Effort: Pulmonary effort is normal. No respiratory distress.     Breath sounds: Normal breath sounds.  Skin:    General: Skin is warm and dry.  Neurological:     General: No focal deficit present.     Mental Status: She is alert and oriented to person, place, and time.  Psychiatric:        Mood and Affect: Mood normal.        Behavior: Behavior normal.      UC Treatments / Results  Labs (all labs ordered are listed, but only abnormal results are displayed) Labs Reviewed - No data to display  EKG   Radiology DG Chest 2  View Result Date: 01/01/2025 CLINICAL DATA:  Cough for 8 days EXAM: CHEST - 2 VIEW COMPARISON:  July 23, 2019 FINDINGS: The heart size and mediastinal contours are within normal limits. Both lungs are clear. The visualized skeletal structures are unremarkable. IMPRESSION: No active cardiopulmonary disease. Electronically Signed   By: Lynwood Landy Raddle M.D.   On: 01/01/2025 11:32    Procedures Procedures (including critical care time)  Medications Ordered in UC Medications - No data to display  Initial Impression / Assessment and Plan / UC Course  I have reviewed the triage vital signs and the nursing notes.  Pertinent labs & imaging results that were available during my care of the patient were reviewed by me and considered in my medical decision making (see chart for details).  Vitals and triage reviewed, patient is hemodynamically stable.  Lungs are vesicular, heart with regular rate and rhythm.  Chest x-ray by my interpretation does not show acute cardiopulmonary abnormality, confirmed with radiology overread.  Suspect acute bronchitis, will treat with steroid burst and cough management.  Plan of care, follow-up care and return precautions given, no questions at this time.  Work note provided.    Final Clinical Impressions(s) / UC Diagnoses   Final diagnoses:  Acute cough  Bronchitis     Discharge Instructions      Take the steroids daily with breakfast to help with lung inflammation  Cough syrup as needed, can cause drowsiness / sedation  Continue OTC treatment w/ cough drops as needed  Stay hydrated and get plenty of rest  Return to clinic for new or urgent symptoms or if no improvement with medications      ED Prescriptions     Medication Sig Dispense Auth. Provider   predniSONE  (DELTASONE ) 20 MG tablet Take 2 tablets (40 mg total) by mouth daily for 5 days. 10 tablet Dreama, Tawny Raspberry  N, FNP   promethazine -dextromethorphan (PROMETHAZINE -DM) 6.25-15 MG/5ML syrup Take  5 mLs by mouth 4 (four) times daily as needed for cough. 118 mL Dreama, Stephnie Parlier  N, FNP      PDMP not reviewed this encounter.     [1]  Social History Tobacco Use   Smoking status: Some Days    Current packs/day: 0.20  Types: Cigarettes   Smokeless tobacco: Never  Vaping Use   Vaping status: Never Used  Substance Use Topics   Alcohol use: Yes   Drug use: No     Dreama Beauford SAILOR, FNP 01/01/25 1142  "

## 2025-01-01 NOTE — ED Triage Notes (Signed)
 Pt c/o coughing x8 days. States worse at night. States now having back and chest pain from coughing. States took multiple OTC meds with no relief.

## 2025-01-01 NOTE — Discharge Instructions (Addendum)
 Take the steroids daily with breakfast to help with lung inflammation  Cough syrup as needed, can cause drowsiness / sedation  Continue OTC treatment w/ cough drops as needed  Stay hydrated and get plenty of rest  Return to clinic for new or urgent symptoms or if no improvement with medications
